# Patient Record
Sex: Male | Born: 1940
Health system: Southern US, Community
[De-identification: ages and names within clinical notes are randomized; demographics above are authoritative.]

## PROBLEM LIST (undated history)

## (undated) DIAGNOSIS — I10 Essential (primary) hypertension: Secondary | ICD-10-CM

## (undated) DIAGNOSIS — E210 Primary hyperparathyroidism: Secondary | ICD-10-CM

## (undated) DIAGNOSIS — M1990 Unspecified osteoarthritis, unspecified site: Secondary | ICD-10-CM

## (undated) DIAGNOSIS — R7303 Prediabetes: Secondary | ICD-10-CM

## (undated) DIAGNOSIS — J439 Emphysema, unspecified: Secondary | ICD-10-CM

## (undated) DIAGNOSIS — J449 Chronic obstructive pulmonary disease, unspecified: Secondary | ICD-10-CM

## (undated) DIAGNOSIS — R918 Other nonspecific abnormal finding of lung field: Secondary | ICD-10-CM

## (undated) DIAGNOSIS — C180 Malignant neoplasm of cecum: Secondary | ICD-10-CM

## (undated) HISTORY — DX: Primary hyperparathyroidism: E21.0

## (undated) HISTORY — DX: Essential (primary) hypertension: I10

## (undated) HISTORY — DX: Malignant neoplasm of cecum: C18.0

## (undated) HISTORY — DX: Prediabetes: R73.03

## (undated) HISTORY — DX: Chronic obstructive pulmonary disease, unspecified: J44.9

## (undated) HISTORY — DX: Unspecified osteoarthritis, unspecified site: M19.90

## (undated) HISTORY — DX: Emphysema, unspecified: J43.9

## (undated) HISTORY — DX: Other nonspecific abnormal finding of lung field: R91.8

---
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
## 2011-11-15 HISTORY — PX: ROTATOR CUFF REPAIR: SHX139

## 2011-11-18 DIAGNOSIS — J449 Chronic obstructive pulmonary disease, unspecified: Secondary | ICD-10-CM | POA: Diagnosis not present

## 2011-11-21 DIAGNOSIS — M75120 Complete rotator cuff tear or rupture of unspecified shoulder, not specified as traumatic: Secondary | ICD-10-CM | POA: Diagnosis not present

## 2011-11-24 DIAGNOSIS — G8918 Other acute postprocedural pain: Secondary | ICD-10-CM | POA: Diagnosis not present

## 2011-11-24 DIAGNOSIS — M75120 Complete rotator cuff tear or rupture of unspecified shoulder, not specified as traumatic: Secondary | ICD-10-CM | POA: Diagnosis not present

## 2011-12-09 DIAGNOSIS — M75120 Complete rotator cuff tear or rupture of unspecified shoulder, not specified as traumatic: Secondary | ICD-10-CM | POA: Diagnosis not present

## 2012-01-13 DIAGNOSIS — M6281 Muscle weakness (generalized): Secondary | ICD-10-CM | POA: Diagnosis not present

## 2012-01-13 DIAGNOSIS — IMO0001 Reserved for inherently not codable concepts without codable children: Secondary | ICD-10-CM | POA: Diagnosis not present

## 2012-01-13 DIAGNOSIS — M25519 Pain in unspecified shoulder: Secondary | ICD-10-CM | POA: Diagnosis not present

## 2012-01-13 DIAGNOSIS — M25619 Stiffness of unspecified shoulder, not elsewhere classified: Secondary | ICD-10-CM | POA: Diagnosis not present

## 2012-02-13 DIAGNOSIS — M25519 Pain in unspecified shoulder: Secondary | ICD-10-CM | POA: Diagnosis not present

## 2012-02-13 DIAGNOSIS — M25619 Stiffness of unspecified shoulder, not elsewhere classified: Secondary | ICD-10-CM | POA: Diagnosis not present

## 2012-02-13 DIAGNOSIS — IMO0001 Reserved for inherently not codable concepts without codable children: Secondary | ICD-10-CM | POA: Diagnosis not present

## 2012-02-15 DIAGNOSIS — M25519 Pain in unspecified shoulder: Secondary | ICD-10-CM | POA: Diagnosis not present

## 2012-02-15 DIAGNOSIS — IMO0001 Reserved for inherently not codable concepts without codable children: Secondary | ICD-10-CM | POA: Diagnosis not present

## 2012-02-15 DIAGNOSIS — M25619 Stiffness of unspecified shoulder, not elsewhere classified: Secondary | ICD-10-CM | POA: Diagnosis not present

## 2012-02-17 DIAGNOSIS — M25619 Stiffness of unspecified shoulder, not elsewhere classified: Secondary | ICD-10-CM | POA: Diagnosis not present

## 2012-02-17 DIAGNOSIS — M25519 Pain in unspecified shoulder: Secondary | ICD-10-CM | POA: Diagnosis not present

## 2012-02-17 DIAGNOSIS — IMO0001 Reserved for inherently not codable concepts without codable children: Secondary | ICD-10-CM | POA: Diagnosis not present

## 2012-02-20 DIAGNOSIS — M25519 Pain in unspecified shoulder: Secondary | ICD-10-CM | POA: Diagnosis not present

## 2012-02-20 DIAGNOSIS — IMO0001 Reserved for inherently not codable concepts without codable children: Secondary | ICD-10-CM | POA: Diagnosis not present

## 2012-02-20 DIAGNOSIS — M25619 Stiffness of unspecified shoulder, not elsewhere classified: Secondary | ICD-10-CM | POA: Diagnosis not present

## 2012-02-22 DIAGNOSIS — IMO0001 Reserved for inherently not codable concepts without codable children: Secondary | ICD-10-CM | POA: Diagnosis not present

## 2012-02-22 DIAGNOSIS — M25519 Pain in unspecified shoulder: Secondary | ICD-10-CM | POA: Diagnosis not present

## 2012-02-22 DIAGNOSIS — M25619 Stiffness of unspecified shoulder, not elsewhere classified: Secondary | ICD-10-CM | POA: Diagnosis not present

## 2012-02-27 DIAGNOSIS — IMO0001 Reserved for inherently not codable concepts without codable children: Secondary | ICD-10-CM | POA: Diagnosis not present

## 2012-02-27 DIAGNOSIS — M25619 Stiffness of unspecified shoulder, not elsewhere classified: Secondary | ICD-10-CM | POA: Diagnosis not present

## 2012-02-27 DIAGNOSIS — M25519 Pain in unspecified shoulder: Secondary | ICD-10-CM | POA: Diagnosis not present

## 2012-02-29 DIAGNOSIS — M25619 Stiffness of unspecified shoulder, not elsewhere classified: Secondary | ICD-10-CM | POA: Diagnosis not present

## 2012-02-29 DIAGNOSIS — IMO0001 Reserved for inherently not codable concepts without codable children: Secondary | ICD-10-CM | POA: Diagnosis not present

## 2012-02-29 DIAGNOSIS — M25519 Pain in unspecified shoulder: Secondary | ICD-10-CM | POA: Diagnosis not present

## 2012-03-02 DIAGNOSIS — M25619 Stiffness of unspecified shoulder, not elsewhere classified: Secondary | ICD-10-CM | POA: Diagnosis not present

## 2012-03-02 DIAGNOSIS — M25519 Pain in unspecified shoulder: Secondary | ICD-10-CM | POA: Diagnosis not present

## 2012-03-02 DIAGNOSIS — IMO0001 Reserved for inherently not codable concepts without codable children: Secondary | ICD-10-CM | POA: Diagnosis not present

## 2012-03-05 DIAGNOSIS — M25519 Pain in unspecified shoulder: Secondary | ICD-10-CM | POA: Diagnosis not present

## 2012-03-05 DIAGNOSIS — IMO0001 Reserved for inherently not codable concepts without codable children: Secondary | ICD-10-CM | POA: Diagnosis not present

## 2012-03-05 DIAGNOSIS — M25619 Stiffness of unspecified shoulder, not elsewhere classified: Secondary | ICD-10-CM | POA: Diagnosis not present

## 2012-03-07 DIAGNOSIS — M25519 Pain in unspecified shoulder: Secondary | ICD-10-CM | POA: Diagnosis not present

## 2012-03-07 DIAGNOSIS — M25619 Stiffness of unspecified shoulder, not elsewhere classified: Secondary | ICD-10-CM | POA: Diagnosis not present

## 2012-03-07 DIAGNOSIS — IMO0001 Reserved for inherently not codable concepts without codable children: Secondary | ICD-10-CM | POA: Diagnosis not present

## 2012-03-12 DIAGNOSIS — M25619 Stiffness of unspecified shoulder, not elsewhere classified: Secondary | ICD-10-CM | POA: Diagnosis not present

## 2012-03-12 DIAGNOSIS — IMO0001 Reserved for inherently not codable concepts without codable children: Secondary | ICD-10-CM | POA: Diagnosis not present

## 2012-03-12 DIAGNOSIS — M25519 Pain in unspecified shoulder: Secondary | ICD-10-CM | POA: Diagnosis not present

## 2012-03-14 DIAGNOSIS — M25619 Stiffness of unspecified shoulder, not elsewhere classified: Secondary | ICD-10-CM | POA: Diagnosis not present

## 2012-03-14 DIAGNOSIS — IMO0001 Reserved for inherently not codable concepts without codable children: Secondary | ICD-10-CM | POA: Diagnosis not present

## 2012-03-14 DIAGNOSIS — M25519 Pain in unspecified shoulder: Secondary | ICD-10-CM | POA: Diagnosis not present

## 2012-03-16 DIAGNOSIS — M25519 Pain in unspecified shoulder: Secondary | ICD-10-CM | POA: Diagnosis not present

## 2012-03-16 DIAGNOSIS — IMO0001 Reserved for inherently not codable concepts without codable children: Secondary | ICD-10-CM | POA: Diagnosis not present

## 2012-03-16 DIAGNOSIS — M25619 Stiffness of unspecified shoulder, not elsewhere classified: Secondary | ICD-10-CM | POA: Diagnosis not present

## 2012-03-19 DIAGNOSIS — M25519 Pain in unspecified shoulder: Secondary | ICD-10-CM | POA: Diagnosis not present

## 2012-03-19 DIAGNOSIS — M25619 Stiffness of unspecified shoulder, not elsewhere classified: Secondary | ICD-10-CM | POA: Diagnosis not present

## 2012-03-19 DIAGNOSIS — IMO0001 Reserved for inherently not codable concepts without codable children: Secondary | ICD-10-CM | POA: Diagnosis not present

## 2012-03-21 DIAGNOSIS — M25519 Pain in unspecified shoulder: Secondary | ICD-10-CM | POA: Diagnosis not present

## 2012-03-21 DIAGNOSIS — M25619 Stiffness of unspecified shoulder, not elsewhere classified: Secondary | ICD-10-CM | POA: Diagnosis not present

## 2012-03-21 DIAGNOSIS — IMO0001 Reserved for inherently not codable concepts without codable children: Secondary | ICD-10-CM | POA: Diagnosis not present

## 2012-03-23 DIAGNOSIS — IMO0001 Reserved for inherently not codable concepts without codable children: Secondary | ICD-10-CM | POA: Diagnosis not present

## 2012-03-23 DIAGNOSIS — M25619 Stiffness of unspecified shoulder, not elsewhere classified: Secondary | ICD-10-CM | POA: Diagnosis not present

## 2012-03-23 DIAGNOSIS — M25519 Pain in unspecified shoulder: Secondary | ICD-10-CM | POA: Diagnosis not present

## 2012-03-28 DIAGNOSIS — M25619 Stiffness of unspecified shoulder, not elsewhere classified: Secondary | ICD-10-CM | POA: Diagnosis not present

## 2012-03-28 DIAGNOSIS — M25519 Pain in unspecified shoulder: Secondary | ICD-10-CM | POA: Diagnosis not present

## 2012-03-28 DIAGNOSIS — IMO0001 Reserved for inherently not codable concepts without codable children: Secondary | ICD-10-CM | POA: Diagnosis not present

## 2012-03-30 DIAGNOSIS — M25619 Stiffness of unspecified shoulder, not elsewhere classified: Secondary | ICD-10-CM | POA: Diagnosis not present

## 2012-03-30 DIAGNOSIS — M25519 Pain in unspecified shoulder: Secondary | ICD-10-CM | POA: Diagnosis not present

## 2012-03-30 DIAGNOSIS — IMO0001 Reserved for inherently not codable concepts without codable children: Secondary | ICD-10-CM | POA: Diagnosis not present

## 2012-04-02 DIAGNOSIS — M25519 Pain in unspecified shoulder: Secondary | ICD-10-CM | POA: Diagnosis not present

## 2012-04-02 DIAGNOSIS — IMO0001 Reserved for inherently not codable concepts without codable children: Secondary | ICD-10-CM | POA: Diagnosis not present

## 2012-04-02 DIAGNOSIS — M25619 Stiffness of unspecified shoulder, not elsewhere classified: Secondary | ICD-10-CM | POA: Diagnosis not present

## 2012-04-04 DIAGNOSIS — M25519 Pain in unspecified shoulder: Secondary | ICD-10-CM | POA: Diagnosis not present

## 2012-04-04 DIAGNOSIS — IMO0001 Reserved for inherently not codable concepts without codable children: Secondary | ICD-10-CM | POA: Diagnosis not present

## 2012-04-04 DIAGNOSIS — M25619 Stiffness of unspecified shoulder, not elsewhere classified: Secondary | ICD-10-CM | POA: Diagnosis not present

## 2012-04-13 DIAGNOSIS — M25519 Pain in unspecified shoulder: Secondary | ICD-10-CM | POA: Diagnosis not present

## 2012-04-13 DIAGNOSIS — IMO0001 Reserved for inherently not codable concepts without codable children: Secondary | ICD-10-CM | POA: Diagnosis not present

## 2012-04-13 DIAGNOSIS — M25619 Stiffness of unspecified shoulder, not elsewhere classified: Secondary | ICD-10-CM | POA: Diagnosis not present

## 2012-04-23 DIAGNOSIS — M25619 Stiffness of unspecified shoulder, not elsewhere classified: Secondary | ICD-10-CM | POA: Diagnosis not present

## 2012-04-23 DIAGNOSIS — M25519 Pain in unspecified shoulder: Secondary | ICD-10-CM | POA: Diagnosis not present

## 2012-04-23 DIAGNOSIS — IMO0001 Reserved for inherently not codable concepts without codable children: Secondary | ICD-10-CM | POA: Diagnosis not present

## 2012-04-25 DIAGNOSIS — M25619 Stiffness of unspecified shoulder, not elsewhere classified: Secondary | ICD-10-CM | POA: Diagnosis not present

## 2012-04-25 DIAGNOSIS — M25519 Pain in unspecified shoulder: Secondary | ICD-10-CM | POA: Diagnosis not present

## 2012-04-25 DIAGNOSIS — IMO0001 Reserved for inherently not codable concepts without codable children: Secondary | ICD-10-CM | POA: Diagnosis not present

## 2012-04-27 DIAGNOSIS — M25619 Stiffness of unspecified shoulder, not elsewhere classified: Secondary | ICD-10-CM | POA: Diagnosis not present

## 2012-04-27 DIAGNOSIS — M25519 Pain in unspecified shoulder: Secondary | ICD-10-CM | POA: Diagnosis not present

## 2012-04-27 DIAGNOSIS — IMO0001 Reserved for inherently not codable concepts without codable children: Secondary | ICD-10-CM | POA: Diagnosis not present

## 2012-04-30 DIAGNOSIS — M25519 Pain in unspecified shoulder: Secondary | ICD-10-CM | POA: Diagnosis not present

## 2012-04-30 DIAGNOSIS — IMO0001 Reserved for inherently not codable concepts without codable children: Secondary | ICD-10-CM | POA: Diagnosis not present

## 2012-04-30 DIAGNOSIS — M25619 Stiffness of unspecified shoulder, not elsewhere classified: Secondary | ICD-10-CM | POA: Diagnosis not present

## 2012-12-04 DIAGNOSIS — H02059 Trichiasis without entropian unspecified eye, unspecified eyelid: Secondary | ICD-10-CM | POA: Diagnosis not present

## 2012-12-04 DIAGNOSIS — H2510 Age-related nuclear cataract, unspecified eye: Secondary | ICD-10-CM | POA: Diagnosis not present

## 2012-12-24 DIAGNOSIS — J449 Chronic obstructive pulmonary disease, unspecified: Secondary | ICD-10-CM | POA: Diagnosis not present

## 2012-12-24 DIAGNOSIS — I10 Essential (primary) hypertension: Secondary | ICD-10-CM | POA: Diagnosis not present

## 2012-12-24 DIAGNOSIS — Z23 Encounter for immunization: Secondary | ICD-10-CM | POA: Diagnosis not present

## 2012-12-25 DIAGNOSIS — I2510 Atherosclerotic heart disease of native coronary artery without angina pectoris: Secondary | ICD-10-CM | POA: Diagnosis not present

## 2012-12-25 DIAGNOSIS — Z125 Encounter for screening for malignant neoplasm of prostate: Secondary | ICD-10-CM | POA: Diagnosis not present

## 2012-12-25 DIAGNOSIS — I10 Essential (primary) hypertension: Secondary | ICD-10-CM | POA: Diagnosis not present

## 2012-12-25 DIAGNOSIS — R7989 Other specified abnormal findings of blood chemistry: Secondary | ICD-10-CM | POA: Diagnosis not present

## 2012-12-25 DIAGNOSIS — R0602 Shortness of breath: Secondary | ICD-10-CM | POA: Diagnosis not present

## 2013-01-21 DIAGNOSIS — R0609 Other forms of dyspnea: Secondary | ICD-10-CM | POA: Diagnosis not present

## 2013-01-22 DIAGNOSIS — J984 Other disorders of lung: Secondary | ICD-10-CM | POA: Diagnosis not present

## 2013-01-22 DIAGNOSIS — R059 Cough, unspecified: Secondary | ICD-10-CM | POA: Diagnosis not present

## 2013-01-22 DIAGNOSIS — I517 Cardiomegaly: Secondary | ICD-10-CM | POA: Diagnosis not present

## 2013-01-25 ENCOUNTER — Encounter: Payer: Self-pay | Admitting: *Deleted

## 2013-01-25 DIAGNOSIS — M1990 Unspecified osteoarthritis, unspecified site: Secondary | ICD-10-CM | POA: Insufficient documentation

## 2013-01-29 ENCOUNTER — Ambulatory Visit (INDEPENDENT_AMBULATORY_CARE_PROVIDER_SITE_OTHER): Payer: Medicare Other | Admitting: Family Medicine

## 2013-01-29 ENCOUNTER — Encounter: Payer: Self-pay | Admitting: Family Medicine

## 2013-01-29 VITALS — BP 150/90 | HR 62 | Temp 98.6°F | Resp 20 | Ht 66.0 in | Wt 180.0 lb

## 2013-01-29 DIAGNOSIS — I10 Essential (primary) hypertension: Secondary | ICD-10-CM

## 2013-01-29 DIAGNOSIS — E210 Primary hyperparathyroidism: Secondary | ICD-10-CM

## 2013-01-29 MED ORDER — AMLODIPINE BESYLATE 5 MG PO TABS: 5.0000 mg | ORAL_TABLET | Freq: Every day | ORAL | Status: DC

## 2013-01-29 NOTE — Progress Notes (Signed)
Subjective:     Patient ID: Nicholas Williams, male   DOB: 05-26-1941, 72 y.o.   MRN: [id number]  HPI  Patient is here for followup of his blood work. He was originally found to have hypercalcemia with a calcium of 11.4 in February.  HCTZ was held in his lab work was repeated on 01/22/2013.  Calcium was again elevated at 11.1 however parathyroid hormone level was also elevated at 184.8.  He denies nephrolithiasis, abdominal pain, or bone pain.  He's never been told his calcium was high previously. With regards to his hypertension, he has been on Diovan 320 mg by mouth daily.  His blood pressure has been averaging 120-152 systolic over 70-94 diastolic.  He denies chest pain, but reports dyspnea on exertion.   Review of Systems  Constitutional: Negative.   HENT: Negative.   Eyes: Negative.   Respiratory: Positive for shortness of breath.   Cardiovascular: Negative.   Gastrointestinal: Negative.   Endocrine: Negative.   Psychiatric/Behavioral: Negative.        Objective:   Physical Exam  Vitals reviewed. Constitutional: He is oriented to person, place, and time. He appears well-developed and well-nourished.  HENT:  Head: Normocephalic and atraumatic.  Eyes: Conjunctivae and EOM are normal. Pupils are equal, round, and reactive to light.  Neck: Normal range of motion. Neck supple. No thyromegaly present.  Cardiovascular: Normal rate, regular rhythm and normal heart sounds.   No murmur heard. Pulmonary/Chest: He has no wheezes. He has rales.  Abdominal: Soft. Bowel sounds are normal.  Musculoskeletal: Normal range of motion.  Neurological: He is alert and oriented to person, place, and time. No cranial nerve deficit.       Assessment:     Primary hyperparathyroidism Hypertension    Plan:     We discussed surgical excision of the parathyroid glands versus clinical monitoring.  Patient elects to proceed clinical monitoring he return in 6 months for a BMP to measure calcium and  creatinine. I also scheduled him for a DEXA scan to evaluate for secondary osteoporosis.  We will also consider starting him on a bisphosphonate.  We discussed indications proceed with surgical excision.  Patient was informed when to return early regarding symptoms.  His blood pressure is high and I will add Norvasc 5 mg by mouth daily, recheck his blood pressures in one month.  Regards to dyspnea on exertion, he will try symbicort in place of Advair for one week.  If dyspnea does not improve discontinue beta blockers given his COPD.  If dyspnea does not improve at that point will consider stress test of the heart.

## 2013-01-29 NOTE — Patient Instructions (Addendum)
followup labs in 6 months.  Call in 1 week to see how your breathing is on symbicort.

## 2013-03-06 ENCOUNTER — Other Ambulatory Visit: Payer: Self-pay | Admitting: Family Medicine

## 2013-03-06 DIAGNOSIS — M810 Age-related osteoporosis without current pathological fracture: Secondary | ICD-10-CM

## 2013-03-25 ENCOUNTER — Ambulatory Visit: Payer: Medicare Other | Admitting: Family Medicine

## 2013-03-26 ENCOUNTER — Other Ambulatory Visit: Payer: Self-pay | Admitting: Family Medicine

## 2013-03-26 DIAGNOSIS — E213 Hyperparathyroidism, unspecified: Secondary | ICD-10-CM

## 2013-03-26 MED ORDER — ALENDRONATE SODIUM 70 MG PO TABS: 70.0000 mg | ORAL_TABLET | ORAL | Status: DC

## 2013-05-02 ENCOUNTER — Ambulatory Visit (INDEPENDENT_AMBULATORY_CARE_PROVIDER_SITE_OTHER): Payer: Medicare Other | Admitting: Family Medicine

## 2013-05-02 ENCOUNTER — Encounter: Payer: Self-pay | Admitting: Family Medicine

## 2013-05-02 VITALS — BP 126/62 | HR 72 | Temp 97.1°F | Resp 22 | Wt 180.0 lb

## 2013-05-02 DIAGNOSIS — I10 Essential (primary) hypertension: Secondary | ICD-10-CM

## 2013-05-02 DIAGNOSIS — E213 Hyperparathyroidism, unspecified: Secondary | ICD-10-CM | POA: Diagnosis not present

## 2013-05-02 LAB — COMPLETE METABOLIC PANEL WITH GFR
Alkaline Phosphatase: 99 U/L (ref 39–117)
GFR, Est Non African American: 89 mL/min
Glucose, Bld: 94 mg/dL (ref 70–99)
Sodium: 140 mEq/L (ref 135–145)
Total Bilirubin: 1 mg/dL (ref 0.3–1.2)
Total Protein: 7 g/dL (ref 6.0–8.3)

## 2013-05-02 MED ORDER — BUDESONIDE-FORMOTEROL FUMARATE 160-4.5 MCG/ACT IN AERO: 2.0000 | INHALATION_SPRAY | Freq: Two times a day (BID) | RESPIRATORY_TRACT | Status: DC

## 2013-05-02 MED ORDER — EPINEPHRINE 0.3 MG/0.3ML IJ SOAJ: 0.3000 mg | Freq: Once | INTRAMUSCULAR | Status: AC

## 2013-05-02 NOTE — Progress Notes (Signed)
Subjective:    Patient ID: Nicholas Williams, male    DOB: 1941/07/29, 72 y.o.   MRN: [id number]  HPI Patient is here for followup of his hypertension and his hyperparathyroidism. He is due to have his calcium level rechecked. His blood pressure is currently well controlled on Norvasc 5 mg by mouth daily, Lopressor 50 mg by mouth twice a day, and Diovan 320 mg by mouth daily.  His home blood pressures range 120 130/80 he denies chest pain and shortness of breath. He is currently taking Fosamax due to his hyperparathyroidism. His insurance was not going to pay for his DEXA scan and so it was not performed. Past Medical History  Diagnosis Date  . COPD (chronic obstructive pulmonary disease)   . Emphysema of lung   . Hypertension   . Arthritis   . Hyperparathyroidism, primary    Current Outpatient Prescriptions on File Prior to Visit  Medication Sig Dispense Refill  . albuterol-ipratropium (COMBIVENT) 18-103 MCG/ACT inhaler Inhale 2 puffs into the lungs every 6 (six) hours as needed for wheezing.      Marland Kitchen alendronate (FOSAMAX) 70 MG tablet Take 1 tablet (70 mg total) by mouth every 7 (seven) days. Take with a full glass of water on an empty stomach.  4 tablet  11  . amLODipine (NORVASC) 5 MG tablet Take 1 tablet (5 mg total) by mouth daily.  90 tablet  3  . aspirin 81 MG tablet Take 81 mg by mouth daily.      . metoprolol (LOPRESSOR) 50 MG tablet Take 50 mg by mouth 2 (two) times daily.      . Tiotropium Bromide Monohydrate (SPIRIVA HANDIHALER IN) Inhale 2 puffs into the lungs daily.      . valsartan (DIOVAN) 320 MG tablet Take 320 mg by mouth daily.       No current facility-administered medications on file prior to visit.   No Known Allergies History   Social History  . Marital Status: Married    Spouse Name: N/A    Number of Children: N/A  . Years of Education: N/A   Occupational History  . Not on file.   Social History Main Topics  . Smoking status: Former Smoker    Types:  Cigarettes  . Smokeless tobacco: Former Neurosurgeon    Quit date: 11/14/1993     Comment: 3 packs a day  . Alcohol Use: Yes     Comment: 3-4 beers a day prn  . Drug Use: No  . Sexually Active: Not on file   Other Topics Concern  . Not on file   Social History Narrative  . No narrative on file      Review of Systems  All other systems reviewed and are negative.       Objective:   Physical Exam  Vitals reviewed. Constitutional: He appears well-developed and well-nourished.  Neck: No thyromegaly present.  Cardiovascular: Normal rate, regular rhythm and normal heart sounds.   No murmur heard. Pulmonary/Chest: Effort normal and breath sounds normal. No respiratory distress. He has no wheezes. He has no rales.  Abdominal: Soft. Bowel sounds are normal. He exhibits no distension. There is no tenderness. There is no rebound.  Lymphadenopathy:    He has no cervical adenopathy.          Assessment & Plan:  1. Hyperparathyroidism Recheck the patient's calcium. Time being he is asymptomatic. He has no history of kidney stones. He has no history of abdominal pain. His blood pressures well  controlled. Unless his calcium continues to rise he does not require surgical excision of the parathyroid glands. - COMPLETE METABOLIC PANEL WITH GFR  2. HTN (hypertension) Blood pressures well controlled. Continue current medications at present dosages.

## 2013-12-19 ENCOUNTER — Other Ambulatory Visit: Payer: Self-pay | Admitting: Family Medicine

## 2014-01-13 ENCOUNTER — Other Ambulatory Visit: Payer: Self-pay | Admitting: Family Medicine

## 2014-01-14 NOTE — Telephone Encounter (Signed)
Medication filled x1 with no refills.   Requires office visit before any further refills can be given.  

## 2014-01-27 ENCOUNTER — Other Ambulatory Visit: Payer: Self-pay | Admitting: Family Medicine

## 2014-02-12 ENCOUNTER — Other Ambulatory Visit: Payer: Self-pay | Admitting: Family Medicine

## 2014-04-14 ENCOUNTER — Other Ambulatory Visit: Payer: Self-pay | Admitting: Family Medicine

## 2014-06-24 ENCOUNTER — Other Ambulatory Visit: Payer: Self-pay | Admitting: Family Medicine

## 2014-07-24 ENCOUNTER — Other Ambulatory Visit: Payer: Self-pay | Admitting: Family Medicine

## 2014-07-25 NOTE — Telephone Encounter (Signed)
Medication filled x1 with no refills.   Requires office visit before any further refills can be given.   Letter sent.  

## 2014-08-19 ENCOUNTER — Ambulatory Visit (INDEPENDENT_AMBULATORY_CARE_PROVIDER_SITE_OTHER): Payer: Medicare Other | Admitting: Family Medicine

## 2014-08-19 ENCOUNTER — Encounter: Payer: Self-pay | Admitting: Family Medicine

## 2014-08-19 VITALS — BP 126/72 | HR 68 | Temp 97.8°F | Resp 20 | Ht 66.0 in | Wt 171.0 lb

## 2014-08-19 DIAGNOSIS — Z0000 Encounter for general adult medical examination without abnormal findings: Secondary | ICD-10-CM

## 2014-08-19 DIAGNOSIS — Z23 Encounter for immunization: Secondary | ICD-10-CM

## 2014-08-19 DIAGNOSIS — E210 Primary hyperparathyroidism: Secondary | ICD-10-CM | POA: Diagnosis not present

## 2014-08-19 DIAGNOSIS — I10 Essential (primary) hypertension: Secondary | ICD-10-CM

## 2014-08-19 DIAGNOSIS — J449 Chronic obstructive pulmonary disease, unspecified: Secondary | ICD-10-CM

## 2014-08-19 NOTE — Progress Notes (Signed)
Subjective:    Patient ID: Nicholas Williams, male    DOB: 22-Jul-1941, 73 y.o.   MRN: [id number]  HPI Patient is here today for complete physical exam. He is due for a colonoscopy as well as a flu shot. He refuses the colonoscopy and the flu shot. The remainder of his vaccines are up-to-date. He does have hyperparathyroidism. He is due for a PTH. Currently we're managing it clinical monitoring and bisphosphonate for prevention of osteoporosis. Patient does have COPD. He is currently taking spiriva daily but finds that he is having to use albuterol 2-3 times a day with heavy exertion. He is not using symbicort because he cannot afford the extra co-pay. Past Medical History  Diagnosis Date  . COPD (chronic obstructive pulmonary disease)   . Emphysema of lung   . Hypertension   . Arthritis   . Hyperparathyroidism, primary    Past Surgical History  Procedure Laterality Date  . Hernia repair  1954  . Appendectomy  1956  . Rotator cuff repair  11/2011   Current Outpatient Prescriptions on File Prior to Visit  Medication Sig Dispense Refill  . albuterol-ipratropium (COMBIVENT) 18-103 MCG/ACT inhaler Inhale 2 puffs into the lungs every 6 (six) hours as needed for wheezing.      Marland Kitchen alendronate (FOSAMAX) 70 MG tablet Take 1 tablet (70 mg total) by mouth every 7 (seven) days. Take with a full glass of water on an empty stomach.  4 tablet  11  . amLODipine (NORVASC) 5 MG tablet TAKE ONE TABLET BY MOUTH DAILY.  30 tablet  0  . aspirin 81 MG tablet Take 81 mg by mouth daily.      . budesonide-formoterol (SYMBICORT) 160-4.5 MCG/ACT inhaler Inhale 2 puffs into the lungs 2 (two) times daily.  1 Inhaler  11  . EPINEPHrine (EPIPEN 2-PAK) 0.3 mg/0.3 mL DEVI Inject 0.3 mLs (0.3 mg total) into the muscle once.  1 Device  2  . losartan (COZAAR) 100 MG tablet TAKE ONE TABLET BY MOUTH EVERY DAY.  30 tablet  11  . metoprolol (LOPRESSOR) 50 MG tablet TAKE ONE TABLET BY MOUTH TWICE DAILY.  60 tablet  5  . SPIRIVA  HANDIHALER 18 MCG inhalation capsule INHALE 1 CAPSULE DAILY USING HANDIHALER DEVICE AS DIRECTED.  30 capsule  6  . Tiotropium Bromide Monohydrate (SPIRIVA HANDIHALER IN) Inhale 2 puffs into the lungs daily.      . valsartan (DIOVAN) 320 MG tablet Take 320 mg by mouth daily.       No current facility-administered medications on file prior to visit.   No Known Allergies History   Social History  . Marital Status: Married    Spouse Name: N/A    Number of Children: N/A  . Years of Education: N/A   Occupational History  . Not on file.   Social History Main Topics  . Smoking status: Former Smoker    Types: Cigarettes  . Smokeless tobacco: Former Systems developer    Quit date: 11/14/1993     Comment: 3 packs a day  . Alcohol Use: Yes     Comment: 3-4 beers a day prn  . Drug Use: No  . Sexual Activity: Not on file   Other Topics Concern  . Not on file   Social History Narrative  . No narrative on file   No family history on file.    Review of Systems  All other systems reviewed and are negative.      Objective:   Physical  Exam  Vitals reviewed. Constitutional: He is oriented to person, place, and time. He appears well-developed and well-nourished. No distress.  HENT:  Head: Normocephalic and atraumatic.  Right Ear: External ear normal.  Left Ear: External ear normal.  Nose: Nose normal.  Mouth/Throat: Oropharynx is clear and moist. No oropharyngeal exudate.  Eyes: Conjunctivae and EOM are normal. Pupils are equal, round, and reactive to light. Right eye exhibits no discharge. Left eye exhibits no discharge. No scleral icterus.  Neck: Normal range of motion. Neck supple. No JVD present. No tracheal deviation present. No thyromegaly present.  Cardiovascular: Normal rate, regular rhythm and normal heart sounds.  Exam reveals no gallop and no friction rub.   No murmur heard. Pulmonary/Chest: Effort normal and breath sounds normal. No stridor. No respiratory distress. He has no  wheezes. He has no rales. He exhibits no tenderness.  Abdominal: Soft. Bowel sounds are normal. He exhibits no distension and no mass. There is no tenderness. There is no rebound and no guarding.  Genitourinary: Rectum normal, prostate normal and penis normal.  Musculoskeletal: Normal range of motion. He exhibits no edema and no tenderness.  Lymphadenopathy:    He has no cervical adenopathy.  Neurological: He is alert and oriented to person, place, and time. He has normal reflexes. He displays normal reflexes. No cranial nerve deficit. He exhibits normal muscle tone. Coordination normal.  Skin: Skin is warm. No rash noted. He is not diaphoretic. No erythema. No pallor.  Psychiatric: He has a normal mood and affect. His behavior is normal. Judgment and thought content normal.          Assessment & Plan:  Need for prophylactic vaccination and inoculation against influenza - Plan: Flu Vaccine QUAD 36+ mos IM  Routine general medical examination at a health care facility - Plan: CBC with Differential, PSA, Medicare  Hyperparathyroidism, primary - Plan: Parathyroid hormone, intact (no Ca)  Essential hypertension - Plan: CBC with Differential, COMPLETE METABOLIC PANEL WITH GFR, Lipid panel  Chronic obstructive pulmonary disease, unspecified COPD, unspecified chronic bronchitis type  patient's blood pressure is excellent. All have him return fasting for a CMP as well as a fasting lipid panel. Goal LDL cholesterols less than 130.DC spiriva and replace with stiolto 2 inh daily.  Recommended flu shot and the patient finally agreed. Recommend colonoscopy but the patient declined. Return fasting for lipid panel PSA CBC PTH.

## 2014-08-21 ENCOUNTER — Other Ambulatory Visit: Payer: Self-pay | Admitting: Family Medicine

## 2014-08-21 NOTE — Telephone Encounter (Signed)
Refill appropriate and filled per protocol. 

## 2014-08-25 ENCOUNTER — Other Ambulatory Visit: Payer: Medicare Other

## 2014-08-25 DIAGNOSIS — Z0000 Encounter for general adult medical examination without abnormal findings: Secondary | ICD-10-CM | POA: Diagnosis not present

## 2014-08-25 DIAGNOSIS — I10 Essential (primary) hypertension: Secondary | ICD-10-CM | POA: Diagnosis not present

## 2014-08-25 DIAGNOSIS — E210 Primary hyperparathyroidism: Secondary | ICD-10-CM | POA: Diagnosis not present

## 2014-08-25 LAB — CBC WITH DIFFERENTIAL/PLATELET
Basophils Absolute: 0 10*3/uL (ref 0.0–0.1)
Basophils Relative: 1 % (ref 0–1)
EOS ABS: 0.1 10*3/uL (ref 0.0–0.7)
Eosinophils Relative: 3 % (ref 0–5)
HEMATOCRIT: 39 % (ref 39.0–52.0)
HEMOGLOBIN: 13.6 g/dL (ref 13.0–17.0)
Lymphocytes Relative: 36 % (ref 12–46)
Lymphs Abs: 1.4 10*3/uL (ref 0.7–4.0)
MCH: 30.1 pg (ref 26.0–34.0)
MCHC: 34.9 g/dL (ref 30.0–36.0)
MCV: 86.3 fL (ref 78.0–100.0)
MONO ABS: 0.4 10*3/uL (ref 0.1–1.0)
MONOS PCT: 10 % (ref 3–12)
Neutro Abs: 2 10*3/uL (ref 1.7–7.7)
Neutrophils Relative %: 50 % (ref 43–77)
Platelets: 200 10*3/uL (ref 150–400)
RBC: 4.52 MIL/uL (ref 4.22–5.81)
RDW: 13.3 % (ref 11.5–15.5)
WBC: 4 10*3/uL (ref 4.0–10.5)

## 2014-08-25 LAB — COMPLETE METABOLIC PANEL WITH GFR
ALBUMIN: 4.1 g/dL (ref 3.5–5.2)
ALK PHOS: 102 U/L (ref 39–117)
ALT: 12 U/L (ref 0–53)
AST: 14 U/L (ref 0–37)
BILIRUBIN TOTAL: 0.8 mg/dL (ref 0.2–1.2)
BUN: 7 mg/dL (ref 6–23)
CO2: 26 meq/L (ref 19–32)
Calcium: 10.5 mg/dL (ref 8.4–10.5)
Chloride: 106 mEq/L (ref 96–112)
Creat: 0.75 mg/dL (ref 0.50–1.35)
GLUCOSE: 107 mg/dL — AB (ref 70–99)
Potassium: 4.1 mEq/L (ref 3.5–5.3)
Sodium: 139 mEq/L (ref 135–145)
Total Protein: 6.6 g/dL (ref 6.0–8.3)

## 2014-08-25 LAB — LIPID PANEL
CHOL/HDL RATIO: 4.2 ratio
CHOLESTEROL: 163 mg/dL (ref 0–200)
HDL: 39 mg/dL — ABNORMAL LOW (ref >39)
LDL Cholesterol: 91 mg/dL (ref 0–99)
Triglycerides: 163 mg/dL — ABNORMAL HIGH (ref <150)
VLDL: 33 mg/dL (ref 0–40)

## 2014-08-26 LAB — PSA, MEDICARE: PSA: 1.03 ng/mL (ref <=4.00)

## 2014-08-26 LAB — PARATHYROID HORMONE, INTACT (NO CA): PTH: 120 pg/mL — ABNORMAL HIGH (ref 14–64)

## 2014-09-17 ENCOUNTER — Telehealth: Payer: Self-pay | Admitting: Family Medicine

## 2014-09-17 MED ORDER — TIOTROPIUM BROMIDE-OLODATEROL 2.5-2.5 MCG/ACT IN AERS: 2.5000 ug | INHALATION_SPRAY | Freq: Every day | RESPIRATORY_TRACT | Status: DC

## 2014-09-17 NOTE — Telephone Encounter (Signed)
Med sent to pharm 

## 2014-09-17 NOTE — Telephone Encounter (Signed)
PA submitted through [email or web address]   Received call from insurance stating that pt must try Anoro as it is on his formulary. OK to change and if so need sig.

## 2014-09-17 NOTE — Telephone Encounter (Signed)
Patient is calling because we gave him samples of stiolto and would now like an rx sent in for this if possible  Pharmacy is [facility]  [phone] with questions

## 2014-09-18 NOTE — Telephone Encounter (Signed)
Ok switching stiolto to [facility]

## 2014-09-22 ENCOUNTER — Telehealth: Payer: Self-pay | Admitting: Family Medicine

## 2014-09-22 MED ORDER — UMECLIDINIUM-VILANTEROL 62.5-25 MCG/INH IN AEPB: INHALATION_SPRAY | RESPIRATORY_TRACT | Status: DC

## 2014-09-22 NOTE — Telephone Encounter (Signed)
Med called to pharm and pt aware that he must try Anoro first before his insurance will cover [place].

## 2014-09-22 NOTE — Telephone Encounter (Signed)
Pt aware and med sent to pharm 

## 2014-09-22 NOTE — Telephone Encounter (Signed)
Patient calling to speak to you ONLY about his stiolto he would barely give my ANY info on the phone [phone]

## 2014-12-18 ENCOUNTER — Other Ambulatory Visit: Payer: Self-pay | Admitting: Family Medicine

## 2015-01-16 ENCOUNTER — Telehealth: Payer: Self-pay | Admitting: Family Medicine

## 2015-01-16 NOTE — Telephone Encounter (Signed)
Patient is calling to speak with you regarding a personal matter  [phone]

## 2015-01-16 NOTE — Telephone Encounter (Signed)
LMTRC

## 2015-01-19 NOTE — Telephone Encounter (Signed)
Spoke to pt 01/18/15 and questions answered

## 2015-03-05 ENCOUNTER — Other Ambulatory Visit: Payer: Self-pay | Admitting: Family Medicine

## 2015-03-19 ENCOUNTER — Other Ambulatory Visit: Payer: Self-pay | Admitting: Family Medicine

## 2015-04-02 ENCOUNTER — Encounter: Payer: Self-pay | Admitting: Family Medicine

## 2015-04-02 ENCOUNTER — Telehealth: Payer: Self-pay | Admitting: Family Medicine

## 2015-04-02 ENCOUNTER — Other Ambulatory Visit: Payer: Self-pay | Admitting: Family Medicine

## 2015-04-02 NOTE — Telephone Encounter (Signed)
Patient requesting speak with you regarding medication

## 2015-04-02 NOTE — Telephone Encounter (Signed)
Med refill x 1.  Pt needs to be seen.  Letter sent

## 2015-04-02 NOTE — Telephone Encounter (Signed)
LMTRC

## 2015-04-03 NOTE — Telephone Encounter (Signed)
Spoke to pt 04/02/15 and questions were answered and med refilled

## 2015-05-04 ENCOUNTER — Other Ambulatory Visit: Payer: Self-pay | Admitting: Family Medicine

## 2015-05-05 ENCOUNTER — Telehealth: Payer: Self-pay | Admitting: Family Medicine

## 2015-05-05 NOTE — Telephone Encounter (Signed)
Pt was sent a letter to come in for an ov - pt is taking care of a very sick wife and is unable to come in for an appt b/c there is no one to take care of her but him. He needs some medications refilled as he received a letter stating we would not refill his meds until ov. Per WTP ok to refill his medications for a year and no appt necessary until that time. Meds sent to pharm and pt aware and if he has any other problems he is to contact me.

## 2015-05-05 NOTE — Telephone Encounter (Signed)
Patient calling to let you know that he needs refills and know that dr pickard needs him to come in for ov for this, he would like to speak to you regarding not being able to come in  Please call him at [phone]

## 2015-05-19 ENCOUNTER — Other Ambulatory Visit: Payer: Self-pay | Admitting: Family Medicine

## 2015-08-03 ENCOUNTER — Other Ambulatory Visit: Payer: Self-pay | Admitting: Family Medicine

## 2015-09-01 ENCOUNTER — Other Ambulatory Visit: Payer: Self-pay | Admitting: Family Medicine

## 2015-09-01 NOTE — Telephone Encounter (Signed)
Medication filled x1 with no refills.   Requires office visit before any further refills can be given.   Letter sent.  

## 2015-09-18 ENCOUNTER — Other Ambulatory Visit: Payer: Self-pay | Admitting: Family Medicine

## 2015-09-18 ENCOUNTER — Encounter: Payer: Self-pay | Admitting: Family Medicine

## 2015-09-18 NOTE — Telephone Encounter (Signed)
Medication refill for one time only.  Patient needs to be seen.  Letter sent for patient to call and schedule 

## 2015-09-29 ENCOUNTER — Telehealth: Payer: Self-pay | Admitting: Family Medicine

## 2015-09-29 NOTE — Telephone Encounter (Signed)
LMTRC

## 2015-09-29 NOTE — Telephone Encounter (Signed)
Patient is calling to speak with you only would not give reason why  [phone]

## 2015-09-29 NOTE — Telephone Encounter (Signed)
Had questions about medication - questions answered

## 2015-11-16 ENCOUNTER — Other Ambulatory Visit: Payer: Self-pay | Admitting: Family Medicine

## 2016-02-15 ENCOUNTER — Other Ambulatory Visit: Payer: Self-pay | Admitting: Family Medicine

## 2016-02-16 NOTE — Telephone Encounter (Signed)
Refill appropriate and filled per protocol. 

## 2016-02-19 ENCOUNTER — Ambulatory Visit: Payer: Medicare Other | Admitting: Family Medicine

## 2016-02-26 ENCOUNTER — Encounter: Payer: Self-pay | Admitting: Family Medicine

## 2016-02-26 ENCOUNTER — Ambulatory Visit (INDEPENDENT_AMBULATORY_CARE_PROVIDER_SITE_OTHER): Payer: Medicare Other | Admitting: Family Medicine

## 2016-02-26 VITALS — BP 142/80 | HR 72 | Temp 97.7°F | Resp 16 | Wt 178.0 lb

## 2016-02-26 DIAGNOSIS — Z125 Encounter for screening for malignant neoplasm of prostate: Secondary | ICD-10-CM

## 2016-02-26 DIAGNOSIS — I10 Essential (primary) hypertension: Secondary | ICD-10-CM | POA: Diagnosis not present

## 2016-02-26 DIAGNOSIS — J449 Chronic obstructive pulmonary disease, unspecified: Secondary | ICD-10-CM | POA: Diagnosis not present

## 2016-02-26 LAB — COMPLETE METABOLIC PANEL WITH GFR
ALT: 13 U/L (ref 9–46)
AST: 14 U/L (ref 10–35)
Albumin: 4.4 g/dL (ref 3.6–5.1)
Alkaline Phosphatase: 117 U/L — ABNORMAL HIGH (ref 40–115)
BUN: 9 mg/dL (ref 7–25)
CHLORIDE: 106 mmol/L (ref 98–110)
CO2: 25 mmol/L (ref 20–31)
CREATININE: 0.74 mg/dL (ref 0.70–1.18)
Calcium: 10.3 mg/dL (ref 8.6–10.3)
GFR, Est African American: >89 mL/min (ref >=60)
GFR, Est Non African American: >89 mL/min (ref >=60)
Glucose, Bld: 111 mg/dL — ABNORMAL HIGH (ref 70–99)
Potassium: 3.8 mmol/L (ref 3.5–5.3)
SODIUM: 141 mmol/L (ref 135–146)
Total Bilirubin: 0.7 mg/dL (ref 0.2–1.2)
Total Protein: 7.2 g/dL (ref 6.1–8.1)

## 2016-02-26 LAB — CBC WITH DIFFERENTIAL/PLATELET
BASOS ABS: 48 {cells}/uL (ref 0–200)
BASOS PCT: 1 %
EOS ABS: 96 {cells}/uL (ref 15–500)
Eosinophils Relative: 2 %
HCT: 41.5 % (ref 38.5–50.0)
Hemoglobin: 13.8 g/dL (ref 13.0–17.0)
LYMPHS ABS: 1488 {cells}/uL (ref 850–3900)
LYMPHS PCT: 31 %
MCH: 28.7 pg (ref 27.0–33.0)
MCHC: 33.3 g/dL (ref 32.0–36.0)
MCV: 86.3 fL (ref 80.0–100.0)
MONO ABS: 432 {cells}/uL (ref 200–950)
MONOS PCT: 9 %
MPV: 10 fL (ref 7.5–12.5)
NEUTROS ABS: 2736 {cells}/uL (ref 1500–7800)
Neutrophils Relative %: 57 %
PLATELETS: 235 10*3/uL (ref 140–400)
RBC: 4.81 MIL/uL (ref 4.20–5.80)
RDW: 14.1 % (ref 11.0–15.0)
WBC: 4.8 10*3/uL (ref 3.8–10.8)

## 2016-02-26 LAB — LIPID PANEL
Cholesterol: 157 mg/dL (ref 125–200)
HDL: 42 mg/dL (ref >=40)
LDL CALC: 94 mg/dL (ref <130)
TRIGLYCERIDES: 105 mg/dL (ref <150)
Total CHOL/HDL Ratio: 3.7 Ratio (ref <=5.0)
VLDL: 21 mg/dL (ref <30)

## 2016-02-26 NOTE — Progress Notes (Signed)
Subjective:    Patient ID: Nicholas Williams, male    DOB: 10-Jun-1941, 75 y.o.   MRN: [id number]  HPI Patient is here today for a general checkup. He has a history of hypertension, COPD and hyperparathyroidism. He is overdue for lab work to monitor his calcium. His blood pressure today is borderline. However he has not had any of his 3 blood pressure medication this morning because he is fasting. He denies any chest pain or dyspnea on exertion. He does have some underlying shortness of breath attributed to his COPD that is chronic and unchanged. He is consistent with taking his anoro.  He has not had any recent COPD exacerbations requiring steroids. He denies any hemoptysis. He denies any productive sputum. On examination today he does have some faint posterior left basilar crackles. I recommended a chest x-ray however the patient states he does not think anything is wrong. He assumes it could be scar tissue from one of his previous bouts of pneumonia which is possible. I strongly recommended a chest x-ray but he would like to hold off at the present time Past Medical History  Diagnosis Date  . COPD (chronic obstructive pulmonary disease) ([place])   . Emphysema of lung ([place])   . Hypertension   . Arthritis   . Hyperparathyroidism, primary [facility])    Past Surgical History  Procedure Laterality Date  . Hernia repair  1954  . Appendectomy  1956  . Rotator cuff repair  11/2011   Current Outpatient Prescriptions on File Prior to Visit  Medication Sig Dispense Refill  . amLODipine (NORVASC) 5 MG tablet TAKE ONE TABLET BY MOUTH DAILY. 30 tablet 3  . ANORO ELLIPTA 62.5-25 MCG/INH AEPB INHALE ONE PUFF DAILY. 60 each 11  . aspirin 81 MG tablet Take 81 mg by mouth daily.    Marland Kitchen EPINEPHrine (EPIPEN 2-PAK) 0.3 mg/0.3 mL DEVI Inject 0.3 mLs (0.3 mg total) into the muscle once. 1 Device 2  . metoprolol (LOPRESSOR) 50 MG tablet TAKE ONE TABLET BY MOUTH TWICE DAILY. 60 tablet 3  . valsartan (DIOVAN) 320 MG tablet  Take 320 mg by mouth daily.     No current facility-administered medications on file prior to visit.   No Known Allergies Social History   Social History  . Marital Status: Married    Spouse Name: N/A  . Number of Children: N/A  . Years of Education: N/A   Occupational History  . Not on file.   Social History Main Topics  . Smoking status: Former Smoker    Types: Cigarettes  . Smokeless tobacco: Former Systems developer    Quit date: 11/14/1993     Comment: 3 packs a day  . Alcohol Use: Yes     Comment: 3-4 beers a day prn  . Drug Use: No  . Sexual Activity: Not on file   Other Topics Concern  . Not on file   Social History Narrative     Review of Systems  All other systems reviewed and are negative.      Objective:   Physical Exam  Neck: No JVD present.  Cardiovascular: Normal rate, regular rhythm and normal heart sounds.   Pulmonary/Chest: Effort normal. No respiratory distress. He has no wheezes. He has rales.  Abdominal: Soft. Bowel sounds are normal. He exhibits no distension and no mass. There is no tenderness. There is no rebound and no guarding.  Musculoskeletal: He exhibits no edema.  Vitals reviewed.         Assessment &  Plan:  Essential hypertension - Plan: CBC with Differential/Platelet, COMPLETE METABOLIC PANEL WITH GFR, Lipid panel  Chronic obstructive pulmonary disease, unspecified COPD, unspecified chronic bronchitis type  Prostate cancer screening - Plan: PSA  Blood pressures borderline however the patient is not on his medication. Continue his current medication at the present dosages. His COPD is stable. I will check a fasting lipid panel. His goal LDL cholesterol is less than 130. I would like him to continue an 81 mg aspirin. I will also check his calcium. If elevated, I would recommend referral to an endocrinologist to discuss possible parathyroidectomy. Unfortunately the patient is extremely busy caring for his wife who has dementia and is also  recently broke her hip. Therefore he is going be very hesitant to have any surgery unless absolutely necessary due to his responsibilities and commitments caring for her

## 2016-02-27 LAB — PSA: PSA: 2.62 ng/mL (ref <=4.00)

## 2016-02-29 ENCOUNTER — Encounter: Payer: Self-pay | Admitting: Family Medicine

## 2016-07-05 ENCOUNTER — Telehealth: Payer: Self-pay | Admitting: Family Medicine

## 2016-07-05 NOTE — Telephone Encounter (Signed)
Patient called he states he tried calling you yesterday. He said that the other nurse couldn't really help him. He would like to talk to you about what has been going on with him.   His wife passed away in 2023/04/14.  CB# [phone]

## 2016-07-06 NOTE — Telephone Encounter (Signed)
Called and spoke to pt and he just wanted to let us know about Carmilia his wife passing away in May and that he would be changing pharmacies to family pharmacy in [place] in [place].

## 2016-07-15 ENCOUNTER — Other Ambulatory Visit: Payer: Self-pay | Admitting: *Deleted

## 2016-07-15 MED ORDER — METOPROLOL TARTRATE 50 MG PO TABS: 50.0000 mg | ORAL_TABLET | Freq: Two times a day (BID) | ORAL | 6 refills | Status: DC

## 2016-07-15 MED ORDER — AMLODIPINE BESYLATE 5 MG PO TABS: 5.0000 mg | ORAL_TABLET | Freq: Every day | ORAL | 6 refills | Status: DC

## 2016-07-15 NOTE — Telephone Encounter (Signed)
Received fax refill request from [facility]

## 2016-08-15 ENCOUNTER — Other Ambulatory Visit: Payer: Self-pay | Admitting: Family Medicine

## 2016-09-22 ENCOUNTER — Ambulatory Visit (INDEPENDENT_AMBULATORY_CARE_PROVIDER_SITE_OTHER): Payer: Medicare Other | Admitting: Family Medicine

## 2016-09-22 ENCOUNTER — Encounter: Payer: Self-pay | Admitting: Family Medicine

## 2016-09-22 VITALS — BP 160/90 | HR 88 | Temp 97.7°F | Resp 22 | Ht 66.0 in | Wt 174.0 lb

## 2016-09-22 DIAGNOSIS — J189 Pneumonia, unspecified organism: Secondary | ICD-10-CM

## 2016-09-22 DIAGNOSIS — I10 Essential (primary) hypertension: Secondary | ICD-10-CM

## 2016-09-22 DIAGNOSIS — J181 Lobar pneumonia, unspecified organism: Secondary | ICD-10-CM

## 2016-09-22 DIAGNOSIS — J811 Chronic pulmonary edema: Secondary | ICD-10-CM | POA: Diagnosis not present

## 2016-09-22 LAB — CBC WITH DIFFERENTIAL/PLATELET
BASOS ABS: 49 {cells}/uL (ref 0–200)
Basophils Relative: 1 %
EOS ABS: 49 {cells}/uL (ref 15–500)
Eosinophils Relative: 1 %
HEMATOCRIT: 41.5 % (ref 38.5–50.0)
HEMOGLOBIN: 13.7 g/dL (ref 13.0–17.0)
LYMPHS ABS: 1519 {cells}/uL (ref 850–3900)
Lymphocytes Relative: 31 %
MCH: 28.5 pg (ref 27.0–33.0)
MCHC: 33 g/dL (ref 32.0–36.0)
MCV: 86.3 fL (ref 80.0–100.0)
MPV: 9.5 fL (ref 7.5–12.5)
Monocytes Absolute: 343 cells/uL (ref 200–950)
Monocytes Relative: 7 %
NEUTROS ABS: 2940 {cells}/uL (ref 1500–7800)
NEUTROS PCT: 60 %
Platelets: 277 10*3/uL (ref 140–400)
RBC: 4.81 MIL/uL (ref 4.20–5.80)
RDW: 13.5 % (ref 11.0–15.0)
WBC: 4.9 10*3/uL (ref 3.8–10.8)

## 2016-09-22 LAB — COMPLETE METABOLIC PANEL WITH GFR
ALBUMIN: 4.1 g/dL (ref 3.6–5.1)
ALK PHOS: 105 U/L (ref 40–115)
ALT: 13 U/L (ref 9–46)
AST: 15 U/L (ref 10–35)
BILIRUBIN TOTAL: 0.7 mg/dL (ref 0.2–1.2)
BUN: 7 mg/dL (ref 7–25)
CALCIUM: 10.2 mg/dL (ref 8.6–10.3)
CO2: 25 mmol/L (ref 20–31)
Chloride: 106 mmol/L (ref 98–110)
Creat: 0.81 mg/dL (ref 0.70–1.18)
GFR, EST NON AFRICAN AMERICAN: 86 mL/min (ref >=60)
GLUCOSE: 111 mg/dL — AB (ref 70–99)
Potassium: 3.4 mmol/L — ABNORMAL LOW (ref 3.5–5.3)
Sodium: 141 mmol/L (ref 135–146)
TOTAL PROTEIN: 7.2 g/dL (ref 6.1–8.1)

## 2016-09-22 MED ORDER — BENZONATATE 200 MG PO CAPS: 200.0000 mg | ORAL_CAPSULE | Freq: Three times a day (TID) | ORAL | 0 refills | Status: DC

## 2016-09-22 MED ORDER — LEVOFLOXACIN 500 MG PO TABS: 500.0000 mg | ORAL_TABLET | Freq: Every day | ORAL | 0 refills | Status: DC

## 2016-09-22 MED ORDER — PREDNISONE 20 MG PO TABS: ORAL_TABLET | ORAL | 0 refills | Status: DC

## 2016-09-22 MED ORDER — CEFTRIAXONE SODIUM 1 G IJ SOLR
1.0000 g | Freq: Once | INTRAMUSCULAR | Status: AC
  Administered 2016-09-22: 1 g via INTRAMUSCULAR

## 2016-09-22 NOTE — Progress Notes (Signed)
Subjective:    Patient ID: Nicholas Williams, male    DOB: Apr 26, 1941, 75 y.o.   MRN: [id number]  HPI Patient symptoms began with a cold 4 weeks ago. Since that time her cough has gradually worsened. He now has a cough productive of white and yellow sputum, worsening shortness of breath, worsening dyspnea on exertion, worsening wheezing. He reports subjective fevers and chills. On examination today he has diminished breath sounds bilaterally with expiratory wheezing and a prominent left basilar crackles and rails concerning for left lower lobe pneumonia. His blood pressure significantly elevated. Pulse oximetry is 95% on room air Past Medical History:  Diagnosis Date  . Arthritis   . COPD (chronic obstructive pulmonary disease) ([place])   . Emphysema of lung ([place])   . Hyperparathyroidism, primary ([place])   . Hypertension   . Prediabetes    Past Surgical History:  Procedure Laterality Date  . APPENDECTOMY  1956  . [place] [employer]  . ROTATOR CUFF REPAIR  11/2011   Current Outpatient Prescriptions on File Prior to Visit  Medication Sig Dispense Refill  . amLODipine (NORVASC) 5 MG tablet Take 1 tablet (5 mg total) by mouth daily. 30 tablet 6  . ANORO ELLIPTA 62.5-25 MCG/INH AEPB INHALE ONE PUFF DAILY 60 each 4  . aspirin 81 MG tablet Take 81 mg by mouth daily.    Marland Kitchen EPINEPHrine (EPIPEN 2-PAK) 0.3 mg/0.3 mL DEVI Inject 0.3 mLs (0.3 mg total) into the muscle once. 1 Device 2  . metoprolol (LOPRESSOR) 50 MG tablet Take 1 tablet (50 mg total) by mouth 2 (two) times daily. 60 tablet 6  . valsartan (DIOVAN) 320 MG tablet Take 320 mg by mouth daily.     No current facility-administered medications on file prior to visit.    No Known Allergies Social History   Social History  . Marital status: Married    Spouse name: N/A  . Number of children: N/A  . Years of education: N/A   Occupational History  . Not on file.   Social History Main Topics  . Smoking status: Former Smoker    Types:  Cigarettes  . Smokeless tobacco: Former Systems developer    Quit date: 11/14/1993     Comment: 3 packs a day  . Alcohol use Yes     Comment: 3-4 beers a day prn  . Drug use: No  . Sexual activity: Not on file   Other Topics Concern  . Not on file   Social History Narrative  . No narrative on file      Review of Systems  All other systems reviewed and are negative.      Objective:   Physical Exam  Constitutional: He appears well-developed and well-nourished.  Neck: Neck supple. No JVD present.  Cardiovascular: Normal rate, regular rhythm and normal heart sounds.   Pulmonary/Chest: He has wheezes. He has rales.  Musculoskeletal: He exhibits no edema.  Vitals reviewed.         Assessment & Plan:  Community acquired pneumonia of left lower lobe of lung ([place]) - Plan: levofloxacin (LEVAQUIN) 500 MG tablet, predniSONE (DELTASONE) 20 MG tablet, CBC with Differential/Platelet, COMPLETE METABOLIC PANEL WITH GFR  Essential hypertension  Begin Levaquin 500 mg by mouth daily for 7 days. Patient will receive 1 g of Rocephin IM 1 now. I'll also check a CBC and a CMP. Given his wheezing, I'll treat him with a prednisone taper pack as he ALSO appears to have COPD exacerbation. I've asked him to  use albuterol 2 puffs inhaled every 4-6 hours. Recheck in one week or immediately should he worsen

## 2016-09-22 NOTE — Addendum Note (Signed)
Addended by: Shary Decamp B on: 09/22/2016 11:58 AM   Modules accepted: Orders

## 2016-10-04 ENCOUNTER — Ambulatory Visit (HOSPITAL_COMMUNITY)
Admission: RE | Admit: 2016-10-04 | Discharge: 2016-10-04 | Disposition: A | Discharge status: Home / Self Care | Payer: Medicare Other | Source: Ambulatory Visit | Attending: Family Medicine | Admitting: Family Medicine

## 2016-10-04 ENCOUNTER — Ambulatory Visit (INDEPENDENT_AMBULATORY_CARE_PROVIDER_SITE_OTHER): Payer: Medicare Other | Admitting: Family Medicine

## 2016-10-04 ENCOUNTER — Encounter: Payer: Self-pay | Admitting: Family Medicine

## 2016-10-04 VITALS — BP 156/98 | HR 58 | Temp 97.6°F | Resp 20 | Wt 178.0 lb

## 2016-10-04 DIAGNOSIS — J181 Lobar pneumonia, unspecified organism: Secondary | ICD-10-CM | POA: Diagnosis not present

## 2016-10-04 DIAGNOSIS — I10 Essential (primary) hypertension: Secondary | ICD-10-CM

## 2016-10-04 DIAGNOSIS — J189 Pneumonia, unspecified organism: Secondary | ICD-10-CM

## 2016-10-04 MED ORDER — AMLODIPINE BESYLATE 10 MG PO TABS: 10.0000 mg | ORAL_TABLET | Freq: Every day | ORAL | 3 refills | Status: DC

## 2016-10-04 NOTE — Progress Notes (Signed)
Subjective:    Patient ID: Nicholas Williams, male    DOB: 29-Nov-1940, 75 y.o.   MRN: [id number]  HPI  09/23/16 Patient symptoms began with a cold 4 weeks ago. Since that time her cough has gradually worsened. He now has a cough productive of white and yellow sputum, worsening shortness of breath, worsening dyspnea on exertion, worsening wheezing. He reports subjective fevers and chills. On examination today he has diminished breath sounds bilaterally with expiratory wheezing and a prominent left basilar crackles and rails concerning for left lower lobe pneumonia. His blood pressure significantly elevated. Pulse oximetry is 95% on room air.  At that time, my plan was: Begin Levaquin 500 mg by mouth daily for 7 days. Patient will receive 1 g of Rocephin IM 1 now. I'll also check a CBC and a CMP. Given his wheezing, I'll treat him with a prednisone taper pack as he ALSO appears to have COPD exacerbation. I've asked him to use albuterol 2 puffs inhaled every 4-6 hours. Recheck in one week or immediately should he worsen  10/04/16 Patient states that he feels much better. He is more than 50% better however on his exam he still has prominent left basilar crackles. The wheezing has improved and has resolved. He denies any hemoptysis. He denies any fever. His blood pressure is elevated today and his heart rate is slightly low Past Medical History:  Diagnosis Date  . Arthritis   . COPD (chronic obstructive pulmonary disease) ([place])   . Emphysema of lung ([place])   . Hyperparathyroidism, primary ([place])   . Hypertension   . Prediabetes    Past Surgical History:  Procedure Laterality Date  . APPENDECTOMY  1956  . [place]  . ROTATOR CUFF REPAIR  11/2011   Current Outpatient Prescriptions on File Prior to Visit  Medication Sig Dispense Refill  . amLODipine (NORVASC) 5 MG tablet Take 1 tablet (5 mg total) by mouth daily. 30 tablet 6  . ANORO ELLIPTA 62.5-25 MCG/INH AEPB INHALE ONE PUFF DAILY 60  each 4  . aspirin 81 MG tablet Take 81 mg by mouth daily.    Marland Kitchen EPINEPHrine (EPIPEN 2-PAK) 0.3 mg/0.3 mL DEVI Inject 0.3 mLs (0.3 mg total) into the muscle once. 1 Device 2  . metoprolol (LOPRESSOR) 50 MG tablet Take 1 tablet (50 mg total) by mouth 2 (two) times daily. 60 tablet 6  . valsartan (DIOVAN) 320 MG tablet Take 320 mg by mouth daily.    . benzonatate (TESSALON) 200 MG capsule Take 1 capsule (200 mg total) by mouth every 8 (eight) hours. (Patient not taking: Reported on 10/04/2016) 30 capsule 0   No current facility-administered medications on file prior to visit.    No Known Allergies Social History   Social History  . Marital status: Married    Spouse name: N/A  . Number of children: N/A  . Years of education: N/A   Occupational History  . Not on file.   Social History Main Topics  . Smoking status: Former Smoker    Types: Cigarettes  . Smokeless tobacco: Former Systems developer    Quit date: 11/14/1993     Comment: 3 packs a day  . Alcohol use Yes     Comment: 3-4 beers a day prn  . Drug use: No  . Sexual activity: Not on file   Other Topics Concern  . Not on file   Social History Narrative  . No narrative on file      Review  of Systems  All other systems reviewed and are negative.      Objective:   Physical Exam  Constitutional: He appears well-developed and well-nourished.  Neck: Neck supple. No JVD present.  Cardiovascular: Normal rate, regular rhythm and normal heart sounds.   Pulmonary/Chest: He has no wheezes. He has rales.  Musculoskeletal: He exhibits no edema.  Vitals reviewed.         Assessment & Plan:  Community acquired pneumonia of left lower lobe of lung ([place]) - Plan: DG Chest 2 View  Benign essential HTN - Plan: amLODipine (NORVASC) 10 MG tablet  Given the persistent abnormal pulmonary findings, I will order a chest x-ray to evaluate the left lower lobe to determine if there is residual pneumonia versus atelectasis versus edema. Meanwhile  increase amlodipine to 10 mg a day to address his elevated blood pressure and decrease metoprolol to 25 mg twice a day due to the bradycardia

## 2016-10-20 ENCOUNTER — Telehealth: Payer: Self-pay | Admitting: Family Medicine

## 2016-10-20 NOTE — Telephone Encounter (Signed)
Pt calling about his blood pressure readings and states that his BP today was 165/93 and pulse was 68. He states that his BP has been in that same range and wanted to know if you want to change/add any medications??

## 2016-10-20 NOTE — Telephone Encounter (Signed)
Add hctz 25 mg poqday

## 2016-10-21 MED ORDER — HYDROCHLOROTHIAZIDE 25 MG PO TABS: 25.0000 mg | ORAL_TABLET | Freq: Every day | ORAL | 3 refills | Status: DC

## 2016-10-21 NOTE — Telephone Encounter (Signed)
Patient aware of providers recommendations via vm and med sent to pharm 

## 2016-11-17 ENCOUNTER — Other Ambulatory Visit: Payer: Self-pay | Admitting: Family Medicine

## 2017-03-31 ENCOUNTER — Other Ambulatory Visit: Payer: Self-pay | Admitting: Family Medicine

## 2017-03-31 MED ORDER — UMECLIDINIUM-VILANTEROL 62.5-25 MCG/INH IN AEPB: INHALATION_SPRAY | RESPIRATORY_TRACT | 4 refills | Status: DC

## 2017-04-11 ENCOUNTER — Telehealth: Payer: Self-pay | Admitting: Family Medicine

## 2017-04-11 MED ORDER — PREDNISONE 20 MG PO TABS: ORAL_TABLET | ORAL | 0 refills | Status: DC

## 2017-04-11 NOTE — Telephone Encounter (Signed)
Try prednisone taper pack, ntbs if no better

## 2017-04-11 NOTE — Telephone Encounter (Signed)
Pt wanted to know if you could call him in something for his sciatica? He is having pain when standing and walking that shoots up and down his left leg to his hip. Feels better while sitting or lying down.

## 2017-04-11 NOTE — Telephone Encounter (Signed)
Medication called/sent to requested pharmacy and pt aware 

## 2017-04-24 ENCOUNTER — Ambulatory Visit (HOSPITAL_COMMUNITY)
Admission: RE | Admit: 2017-04-24 | Discharge: 2017-04-24 | Disposition: A | Discharge status: Home / Self Care | Payer: Medicare Other | Source: Ambulatory Visit | Attending: Family Medicine | Admitting: Family Medicine

## 2017-04-24 ENCOUNTER — Ambulatory Visit (INDEPENDENT_AMBULATORY_CARE_PROVIDER_SITE_OTHER): Payer: Medicare Other | Admitting: Family Medicine

## 2017-04-24 ENCOUNTER — Encounter: Payer: Self-pay | Admitting: Family Medicine

## 2017-04-24 VITALS — BP 136/84 | HR 86 | Temp 97.8°F | Resp 18 | Ht 66.0 in | Wt 159.0 lb

## 2017-04-24 DIAGNOSIS — I700 Atherosclerosis of aorta: Secondary | ICD-10-CM | POA: Insufficient documentation

## 2017-04-24 DIAGNOSIS — M47816 Spondylosis without myelopathy or radiculopathy, lumbar region: Secondary | ICD-10-CM | POA: Diagnosis not present

## 2017-04-24 DIAGNOSIS — M5431 Sciatica, right side: Secondary | ICD-10-CM | POA: Insufficient documentation

## 2017-04-24 DIAGNOSIS — M4186 Other forms of scoliosis, lumbar region: Secondary | ICD-10-CM | POA: Diagnosis not present

## 2017-04-24 MED ORDER — HYDROMORPHONE HCL 2 MG PO TABS: 2.0000 mg | ORAL_TABLET | ORAL | 0 refills | Status: DC | PRN

## 2017-04-24 MED ORDER — PREDNISONE 20 MG PO TABS: ORAL_TABLET | ORAL | 0 refills | Status: DC

## 2017-04-24 NOTE — Progress Notes (Signed)
Subjective:    Patient ID: Nicholas Williams, male    DOB: 1941/06/26, 76 y.o.   MRN: [id number]  HPI Patient has been reporting severe pain radiating from his right posterior hip down his leg into his right shin and into his right foot for more than 6 weeks. The pain has been gradually increasing. He is tried outpatient therapy including a prednisone taper pack with no relief. He is tried oxycodone with no relief. Even with rest and inactivity over the last 6 weeks the pain is intensifying. Here today on exam, he has full internal and external rotation of the right hip with no pain. He has full flexion and extension of the right hip with no pain. He has normal range of motion in the right knee with no pain. He has a positive right straight leg raise. He has diminished ankle jerk reflex on the right. He has normal patellar reflexes. He denies any numbness in his foot but he does report tingling in his anterior leg. There are no palpable abnormalities on exam. Past Medical History:  Diagnosis Date  . Arthritis   . COPD (chronic obstructive pulmonary disease) ([place])   . Emphysema of lung ([place])   . Hyperparathyroidism, primary ([place])   . Hypertension   . Prediabetes    Past Surgical History:  Procedure Laterality Date  . APPENDECTOMY  1956  . [place]  . ROTATOR CUFF REPAIR  11/2011   Current Outpatient Prescriptions on File Prior to Visit  Medication Sig Dispense Refill  . amLODipine (NORVASC) 10 MG tablet Take 1 tablet (10 mg total) by mouth daily. 90 tablet 3  . amLODipine (NORVASC) 5 MG tablet Take 1 tablet (5 mg total) by mouth daily. 30 tablet 6  . aspirin 81 MG tablet Take 81 mg by mouth daily.    Marland Kitchen EPINEPHrine (EPIPEN 2-PAK) 0.3 mg/0.3 mL DEVI Inject 0.3 mLs (0.3 mg total) into the muscle once. 1 Device 2  . hydrochlorothiazide (HYDRODIURIL) 25 MG tablet Take 1 tablet (25 mg total) by mouth daily. 90 tablet 3  . losartan (COZAAR) 100 MG tablet TAKE 1 TABLET BY MOUTH EVERY DAY  30 tablet 11  . metoprolol (LOPRESSOR) 50 MG tablet Take 1 tablet (50 mg total) by mouth 2 (two) times daily. 60 tablet 6  . predniSONE (DELTASONE) 20 MG tablet 3 tabs po qd x 2 days, 2 tabs po qd x 2 days, 1 tab po qd x 2 days 12 tablet 0  . umeclidinium-vilanterol (ANORO ELLIPTA) 62.5-25 MCG/INH AEPB INHALE ONE PUFF DAILY 60 each 4  . valsartan (DIOVAN) 320 MG tablet Take 320 mg by mouth daily.    . benzonatate (TESSALON) 200 MG capsule Take 1 capsule (200 mg total) by mouth every 8 (eight) hours. (Patient not taking: Reported on 10/04/2016) 30 capsule 0   No current facility-administered medications on file prior to visit.    No Known Allergies Social History   Social History  . Marital status: Married    Spouse name: N/A  . Number of children: N/A  . Years of education: N/A   Occupational History  . Not on file.   Social History Main Topics  . Smoking status: Former Smoker    Types: Cigarettes  . Smokeless tobacco: Former Systems developer    Quit date: 11/14/1993     Comment: 3 packs a day  . Alcohol use Yes     Comment: 3-4 beers a day prn  . Drug use: No  .  Sexual activity: Not on file   Other Topics Concern  . Not on file   Social History Narrative  . No narrative on file      Review of Systems  All other systems reviewed and are negative.      Objective:   Physical Exam  Cardiovascular: Normal rate, regular rhythm and normal heart sounds.   Pulmonary/Chest: Effort normal. He has wheezes.  Musculoskeletal:       Right hip: He exhibits normal range of motion, normal strength and no tenderness.       Right knee: He exhibits normal range of motion. No tenderness found.       Lumbar back: He exhibits decreased range of motion and tenderness. He exhibits no pain.       Legs: Vitals reviewed.   The radiation of the pain as outlined in red on the diagram      Assessment & Plan:  Right sided sciatica - Plan: HYDROmorphone (DILAUDID) 2 MG tablet, predniSONE (DELTASONE)  20 MG tablet, DG Lumbar Spine Complete, BASIC METABOLIC PANEL WITH GFR, CBC with Differential/Platelet, MR Lumbar Spine Wo Contrast  Symptoms are consistent with right-sided sciatica. Symptoms of them present for greater than 6 weeks and are getting worse despite conservative therapy with prednisone and oxycodone. He even took 10 mg of oxycodone every 4 hours with no relief. I will try to get the patient scheduled for an MRI as quickly as possible to see if he's a candidate for an epidural steroid injection. We'll try an additional round of prednisone taper pack. Obtain x-ray of the lumbar spine to rule out fracture in the back or occult malignancy. Begin Dilaudid 2 mg every 6 hours as needed. Caution the patient about respiratory depression given his age but he is failing milder pain medication

## 2017-04-25 LAB — CBC WITH DIFFERENTIAL/PLATELET
Basophils Absolute: 79 cells/uL (ref 0–200)
Basophils Relative: 1 %
EOS PCT: 0 %
Eosinophils Absolute: 0 cells/uL — ABNORMAL LOW (ref 15–500)
HEMATOCRIT: 38.7 % (ref 38.5–50.0)
Hemoglobin: 12.4 g/dL — ABNORMAL LOW (ref 13.0–17.0)
LYMPHS PCT: 22 %
Lymphs Abs: 1738 cells/uL (ref 850–3900)
MCH: 25.3 pg — ABNORMAL LOW (ref 27.0–33.0)
MCHC: 32 g/dL (ref 32.0–36.0)
MCV: 79 fL — ABNORMAL LOW (ref 80.0–100.0)
MPV: 10 fL (ref 7.5–12.5)
Monocytes Absolute: 711 cells/uL (ref 200–950)
Monocytes Relative: 9 %
NEUTROS PCT: 68 %
Neutro Abs: 5372 cells/uL (ref 1500–7800)
Platelets: 356 10*3/uL (ref 140–400)
RBC: 4.9 MIL/uL (ref 4.20–5.80)
RDW: 14.4 % (ref 11.0–15.0)
WBC: 7.9 10*3/uL (ref 3.8–10.8)

## 2017-04-25 LAB — BASIC METABOLIC PANEL WITH GFR
BUN: 16 mg/dL (ref 7–25)
CALCIUM: 10.9 mg/dL — AB (ref 8.6–10.3)
CO2: 24 mmol/L (ref 20–31)
CREATININE: 0.89 mg/dL (ref 0.70–1.18)
Chloride: 101 mmol/L (ref 98–110)
GFR, Est Non African American: 82 mL/min (ref >=60)
Glucose, Bld: 100 mg/dL — ABNORMAL HIGH (ref 70–99)
Potassium: 3.6 mmol/L (ref 3.5–5.3)
Sodium: 138 mmol/L (ref 135–146)

## 2017-04-28 ENCOUNTER — Ambulatory Visit (HOSPITAL_COMMUNITY)
Admission: RE | Admit: 2017-04-28 | Discharge: 2017-04-28 | Disposition: A | Discharge status: Home / Self Care | Payer: Medicare Other | Source: Ambulatory Visit | Attending: Family Medicine | Admitting: Family Medicine

## 2017-04-28 ENCOUNTER — Ambulatory Visit (HOSPITAL_COMMUNITY): Payer: Medicare Other

## 2017-04-28 DIAGNOSIS — M5431 Sciatica, right side: Secondary | ICD-10-CM | POA: Diagnosis not present

## 2017-04-28 DIAGNOSIS — M5136 Other intervertebral disc degeneration, lumbar region: Secondary | ICD-10-CM | POA: Diagnosis not present

## 2017-04-28 DIAGNOSIS — M48061 Spinal stenosis, lumbar region without neurogenic claudication: Secondary | ICD-10-CM | POA: Diagnosis not present

## 2017-04-28 DIAGNOSIS — M545 Low back pain: Secondary | ICD-10-CM | POA: Diagnosis not present

## 2017-05-01 ENCOUNTER — Other Ambulatory Visit: Payer: Self-pay | Admitting: Family Medicine

## 2017-05-01 DIAGNOSIS — G588 Other specified mononeuropathies: Secondary | ICD-10-CM

## 2017-05-02 ENCOUNTER — Other Ambulatory Visit: Payer: Self-pay | Admitting: Family Medicine

## 2017-05-02 DIAGNOSIS — G588 Other specified mononeuropathies: Secondary | ICD-10-CM

## 2017-05-02 MED ORDER — METOPROLOL TARTRATE 50 MG PO TABS: 50.0000 mg | ORAL_TABLET | Freq: Two times a day (BID) | ORAL | 11 refills | Status: DC

## 2017-05-03 ENCOUNTER — Ambulatory Visit
Admission: RE | Admit: 2017-05-03 | Discharge: 2017-05-03 | Disposition: A | Discharge status: Home / Self Care | Payer: Medicare Other | Source: Ambulatory Visit | Attending: Family Medicine | Admitting: Family Medicine

## 2017-05-03 DIAGNOSIS — M47817 Spondylosis without myelopathy or radiculopathy, lumbosacral region: Secondary | ICD-10-CM | POA: Diagnosis not present

## 2017-05-03 DIAGNOSIS — G588 Other specified mononeuropathies: Secondary | ICD-10-CM

## 2017-05-03 MED ORDER — IOPAMIDOL (ISOVUE-M 200) INJECTION 41%
1.0000 mL | Freq: Once | INTRAMUSCULAR | Status: AC
  Administered 2017-05-03: 1 mL via EPIDURAL

## 2017-05-03 MED ORDER — METHYLPREDNISOLONE ACETATE 40 MG/ML INJ SUSP (RADIOLOG
120.0000 mg | Freq: Once | INTRAMUSCULAR | Status: AC
  Administered 2017-05-03: 120 mg via EPIDURAL

## 2017-05-03 NOTE — Discharge Instructions (Signed)

## 2017-05-09 ENCOUNTER — Telehealth: Payer: Self-pay | Admitting: Family Medicine

## 2017-05-09 ENCOUNTER — Other Ambulatory Visit: Payer: Medicare Other

## 2017-05-09 DIAGNOSIS — M545 Low back pain: Secondary | ICD-10-CM

## 2017-05-09 NOTE — Telephone Encounter (Signed)
Recommend neurosurgery or ortho spine - either one is fine See if he has seen anyone in the past.   He has forminal stenosis, pinched nerve lumbar spine, DDD lumbar spine He can return to the oxycodone in the meantime

## 2017-05-09 NOTE — Telephone Encounter (Signed)
Pt called and states that he had the ESI last Wednesday and it has not helped at all. He is still in a lot of pain and can not tolerate the pain med as it constipates him too bad and was wondering should her go for another ESI or does he need to see a specialist? MD Please advise.

## 2017-05-10 NOTE — Telephone Encounter (Signed)
Spoke to pt and is aware of recommendations and would like for Dr. Dennard Schaumann to see note and give his opinion. He means no disrespect but would like [facility]'s opinion on this before proceeding.

## 2017-05-15 NOTE — Telephone Encounter (Signed)
Patient aware of providers recommendations. He can get another injection - sometimes it takes 2-3 injections to see benefits. He will call and set up appt to have another injection.

## 2017-05-15 NOTE — Telephone Encounter (Signed)
I would definitely consult neurosurgery.  Meanwhile, he can take samples of movantik for hte constipation which should stop that so he can take the pain med.

## 2017-05-16 ENCOUNTER — Other Ambulatory Visit: Payer: Self-pay | Admitting: Family Medicine

## 2017-05-16 DIAGNOSIS — G8929 Other chronic pain: Secondary | ICD-10-CM

## 2017-05-16 DIAGNOSIS — M545 Low back pain: Principal | ICD-10-CM

## 2017-05-24 ENCOUNTER — Other Ambulatory Visit: Payer: Self-pay | Admitting: *Deleted

## 2017-05-24 MED ORDER — LOSARTAN POTASSIUM 100 MG PO TABS: 100.0000 mg | ORAL_TABLET | Freq: Every day | ORAL | 3 refills | Status: DC

## 2017-05-31 ENCOUNTER — Ambulatory Visit
Admission: RE | Admit: 2017-05-31 | Discharge: 2017-05-31 | Disposition: A | Discharge status: Home / Self Care | Payer: Medicare Other | Source: Ambulatory Visit | Attending: Family Medicine | Admitting: Family Medicine

## 2017-05-31 ENCOUNTER — Other Ambulatory Visit: Payer: Self-pay | Admitting: Family Medicine

## 2017-05-31 DIAGNOSIS — M47817 Spondylosis without myelopathy or radiculopathy, lumbosacral region: Secondary | ICD-10-CM | POA: Diagnosis not present

## 2017-05-31 DIAGNOSIS — G8929 Other chronic pain: Secondary | ICD-10-CM

## 2017-05-31 DIAGNOSIS — M545 Low back pain: Principal | ICD-10-CM

## 2017-05-31 MED ORDER — IOPAMIDOL (ISOVUE-M 200) INJECTION 41%
1.0000 mL | Freq: Once | INTRAMUSCULAR | Status: AC
  Administered 2017-05-31: 1 mL via EPIDURAL

## 2017-05-31 MED ORDER — METHYLPREDNISOLONE ACETATE 40 MG/ML INJ SUSP (RADIOLOG
120.0000 mg | Freq: Once | INTRAMUSCULAR | Status: AC
  Administered 2017-05-31: 120 mg via EPIDURAL

## 2017-08-10 ENCOUNTER — Telehealth: Payer: Self-pay | Admitting: Family Medicine

## 2017-08-10 NOTE — Telephone Encounter (Signed)
Given his age and copd, really needs to be seen.  If he just wants cough med, tessalon perles 200 mg q 8 hrs.

## 2017-08-10 NOTE — Telephone Encounter (Signed)
Pt called [facility] stating that he has a dry cough that he can not get rid of and is worried he might get PNA and wanted to know if we could call him in something? Please advise

## 2017-08-10 NOTE — Telephone Encounter (Signed)
Pt aware and has some at home will take and if no help will call for an apt.

## 2017-09-05 ENCOUNTER — Other Ambulatory Visit: Payer: Self-pay | Admitting: Family Medicine

## 2017-09-06 ENCOUNTER — Other Ambulatory Visit: Payer: Self-pay | Admitting: Family Medicine

## 2017-09-22 ENCOUNTER — Other Ambulatory Visit: Payer: Self-pay | Admitting: Family Medicine

## 2017-09-22 DIAGNOSIS — I10 Essential (primary) hypertension: Secondary | ICD-10-CM

## 2017-09-29 ENCOUNTER — Other Ambulatory Visit: Payer: Self-pay | Admitting: Family Medicine

## 2017-09-29 MED ORDER — UMECLIDINIUM-VILANTEROL 62.5-25 MCG/INH IN AEPB: INHALATION_SPRAY | RESPIRATORY_TRACT | 0 refills | Status: AC

## 2018-01-09 ENCOUNTER — Encounter: Payer: Self-pay | Admitting: Family Medicine

## 2018-01-09 ENCOUNTER — Ambulatory Visit (INDEPENDENT_AMBULATORY_CARE_PROVIDER_SITE_OTHER): Payer: Medicare Other | Admitting: Family Medicine

## 2018-01-09 VITALS — BP 120/68 | HR 87 | Temp 97.8°F | Resp 20 | Ht 66.0 in | Wt 173.0 lb

## 2018-01-09 DIAGNOSIS — R5383 Other fatigue: Secondary | ICD-10-CM | POA: Diagnosis not present

## 2018-01-09 DIAGNOSIS — R0609 Other forms of dyspnea: Secondary | ICD-10-CM | POA: Diagnosis not present

## 2018-01-09 DIAGNOSIS — D649 Anemia, unspecified: Secondary | ICD-10-CM | POA: Diagnosis not present

## 2018-01-09 DIAGNOSIS — D509 Iron deficiency anemia, unspecified: Secondary | ICD-10-CM | POA: Diagnosis not present

## 2018-01-09 DIAGNOSIS — I10 Essential (primary) hypertension: Secondary | ICD-10-CM

## 2018-01-09 DIAGNOSIS — J449 Chronic obstructive pulmonary disease, unspecified: Secondary | ICD-10-CM | POA: Diagnosis not present

## 2018-01-09 DIAGNOSIS — Z1321 Encounter for screening for nutritional disorder: Secondary | ICD-10-CM | POA: Diagnosis not present

## 2018-01-09 MED ORDER — ALBUTEROL SULFATE HFA 108 (90 BASE) MCG/ACT IN AERS: 2.0000 | INHALATION_SPRAY | Freq: Four times a day (QID) | RESPIRATORY_TRACT | 3 refills | Status: AC | PRN

## 2018-01-09 NOTE — Progress Notes (Signed)
Subjective:    Patient ID: Nicholas Williams, male    DOB: [birth year], 77 y.o.   MRN: [id number]  HPI Patient is here today requesting oxygen.  He has a history of COPD.  His wife previously was on oxygen prior to her death and she saw benefit from oxygen regarding dyspnea on exertion.  He is experiencing significant dyspnea on exertion and is interested in trying oxygen as well.  I perform pulmonary function test today in the office which indicated severe obstruction.  FEV1 was 0.91 L.  FVC was 1.93 L.  FEV1 to FVC ratio was 47% indicating obstructive lung disease.  FEV1 was 38% of predicted indicating stage III borderline stage IV COPD.  Patient is compliant with anoro which he uses daily.  However he sees very little benefit from.  In fact he is using Combivent which was his wife's prescription 6 and 7 times a day for shortness of breath.  He states that every time he does any work, he becomes extremely winded.  He denies any chest pain.  He is 94% on room air sitting.  With ambulation, his pulse oximetry actually improved to 96% which does not qualify for oxygen.  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 Past Medical History:  Diagnosis Date  . Arthritis   . COPD (chronic obstructive pulmonary disease) ([place])   . Emphysema of lung ([place])   . Hyperparathyroidism, primary ([place])   . Hypertension   . Prediabetes    Past Surgical History:  Procedure Laterality Date  . APPENDECTOMY  1956  . [place]  . ROTATOR CUFF REPAIR  11/2011   Current Outpatient Medications on File Prior to Visit  Medication Sig Dispense Refill  . amLODipine (NORVASC) 10 MG tablet TAKE 1 TABLET (10 MG TOTAL) BY MOUTH DAILY. 90 tablet 3  . aspirin 81 MG tablet Take 81 mg by mouth daily.    Marland Kitchen EPINEPHrine (EPIPEN 2-PAK) 0.3 mg/0.3 mL DEVI Inject 0.3 mLs (0.3  mg total) into the muscle once. 1 Device 2  . hydrochlorothiazide (HYDRODIURIL) 25 MG tablet TAKE 1 TABLET (25 MG TOTAL) BY MOUTH DAILY. 90 tablet 3  . losartan (COZAAR) 100 MG tablet Take 1 tablet (100 mg total) by mouth daily. 90 tablet 3  . metoprolol tartrate (LOPRESSOR) 50 MG tablet Take 1 tablet (50 mg total) by mouth 2 (two) times daily. (Patient taking differently: Take 25 mg by mouth 2 (two) times daily. ) 60 tablet 11  . umeclidinium-vilanterol (ANORO ELLIPTA) 62.5-25 MCG/INH AEPB INHALE ONE PUFF DAILY 30 each 0   No current facility-administered medications on file prior to visit.    No Known Allergies Social History   Socioeconomic History  . Marital status: Married    Spouse name: Not on file  . Number of children: Not on file  . Years of education: Not on file  . Highest education level: Not on file  Social Needs  . Financial resource strain: Not on file  . Food insecurity - worry: Not on file  . Food insecurity - inability: Not on file  . Transportation needs - medical: Not on file  . Transportation needs - non-medical: Not on file  Occupational History  . Not on file  Tobacco Use  . Smoking status: Former Smoker    Types: Cigarettes  . Smokeless tobacco: Former Systems developer  Quit date: 11/14/1993  . Tobacco comment: 3 packs a day  Substance and Sexual Activity  . Alcohol use: Yes    Comment: 3-4 beers a day prn  . Drug use: No  . Sexual activity: Not on file  Other Topics Concern  . Not on file  Social History Narrative  . Not on file      Review of Systems  All other systems reviewed and are negative.      Objective:   Physical Exam  Constitutional: He appears well-developed and well-nourished.  Neck: Neck supple. No JVD present.  Cardiovascular: Normal rate, regular rhythm and normal heart sounds.  Pulmonary/Chest: He has wheezes. He has rales.  Abdominal: Soft. Bowel sounds are normal. He exhibits no distension. There is no tenderness. There is no  rebound.  Musculoskeletal: He exhibits no edema.  Lymphadenopathy:    He has no cervical adenopathy.  Vitals reviewed.         Assessment & Plan:  Chronic obstructive pulmonary disease, unspecified COPD type ([place])  Benign essential HTN - Plan: CBC with Differential/Platelet, COMPLETE METABOLIC PANEL WITH GFR, Lipid panel  DOE (dyspnea on exertion) - Plan: ECHOCARDIOGRAM COMPLETE  Patient does not qualify for oxygen and I am concerned about his severe dyspnea on exertion indicating other lung disease other than the COPD which he apparently has on pulmonary function testing.  I have recommended a high-resolution CT scan of the chest to evaluate for interstitial lung disease.  I have also recommended an echocardiogram to evaluate for congestive heart failure/systolic dysfunction.

## 2018-01-11 LAB — CBC WITH DIFFERENTIAL/PLATELET
BASOS ABS: 70 {cells}/uL (ref 0–200)
Basophils Relative: 0.8 %
Eosinophils Absolute: 123 cells/uL (ref 15–500)
Eosinophils Relative: 1.4 %
HEMATOCRIT: 28.5 % — AB (ref 38.5–50.0)
Hemoglobin: 8.5 g/dL — ABNORMAL LOW (ref 13.2–17.1)
LYMPHS ABS: 1971 {cells}/uL (ref 850–3900)
MCH: 19.5 pg — ABNORMAL LOW (ref 27.0–33.0)
MCHC: 29.8 g/dL — ABNORMAL LOW (ref 32.0–36.0)
MCV: 65.5 fL — AB (ref 80.0–100.0)
MPV: 10.7 fL (ref 7.5–12.5)
Monocytes Relative: 9 %
NEUTROS PCT: 66.4 %
Neutro Abs: 5843 cells/uL (ref 1500–7800)
Platelets: 419 10*3/uL — ABNORMAL HIGH (ref 140–400)
RBC: 4.35 10*6/uL (ref 4.20–5.80)
RDW: 16.5 % — AB (ref 11.0–15.0)
Total Lymphocyte: 22.4 %
WBC: 8.8 10*3/uL (ref 3.8–10.8)
WBCMIX: 792 {cells}/uL (ref 200–950)

## 2018-01-11 LAB — COMPLETE METABOLIC PANEL WITH GFR
AG RATIO: 1.1 (calc) (ref 1.0–2.5)
ALBUMIN MSPROF: 4 g/dL (ref 3.6–5.1)
ALT: 10 U/L (ref 9–46)
AST: 13 U/L (ref 10–35)
Alkaline phosphatase (APISO): 118 U/L — ABNORMAL HIGH (ref 40–115)
BUN / CREAT RATIO: 12 (calc) (ref 6–22)
BUN: 14 mg/dL (ref 7–25)
CO2: 26 mmol/L (ref 20–32)
Calcium: 11.6 mg/dL — ABNORMAL HIGH (ref 8.6–10.3)
Chloride: 102 mmol/L (ref 98–110)
Creat: 1.19 mg/dL — ABNORMAL HIGH (ref 0.70–1.18)
GFR, EST AFRICAN AMERICAN: 67 mL/min/{1.73_m2} (ref >=60)
GFR, Est Non African American: 58 mL/min/{1.73_m2} — ABNORMAL LOW (ref >=60)
GLOBULIN: 3.5 g/dL (ref 1.9–3.7)
Glucose, Bld: 106 mg/dL — ABNORMAL HIGH (ref 65–99)
POTASSIUM: 4.4 mmol/L (ref 3.5–5.3)
SODIUM: 138 mmol/L (ref 135–146)
Total Bilirubin: 0.6 mg/dL (ref 0.2–1.2)
Total Protein: 7.5 g/dL (ref 6.1–8.1)

## 2018-01-11 LAB — LIPID PANEL
Cholesterol: 147 mg/dL (ref <200)
HDL: 36 mg/dL — AB (ref >40)
LDL Cholesterol (Calc): 89 mg/dL (calc)
NON-HDL CHOLESTEROL (CALC): 111 mg/dL (ref <130)
Total CHOL/HDL Ratio: 4.1 (calc) (ref <5.0)
Triglycerides: 127 mg/dL (ref <150)

## 2018-01-11 LAB — TEST AUTHORIZATION

## 2018-01-11 LAB — VITAMIN B12: Vitamin B-12: 297 pg/mL (ref 200–1100)

## 2018-01-11 LAB — IRON,TIBC AND FERRITIN PANEL
%SAT: 5 % — AB (ref 15–60)
Ferritin: 5 ng/mL — ABNORMAL LOW (ref 20–380)
IRON: 22 ug/dL — AB (ref 50–180)
TIBC: 472 ug/dL — AB (ref 250–425)

## 2018-01-11 LAB — CBC MORPHOLOGY

## 2018-01-13 DIAGNOSIS — Z1211 Encounter for screening for malignant neoplasm of colon: Secondary | ICD-10-CM | POA: Diagnosis not present

## 2018-01-14 DIAGNOSIS — Z1211 Encounter for screening for malignant neoplasm of colon: Secondary | ICD-10-CM | POA: Diagnosis not present

## 2018-01-15 DIAGNOSIS — Z1211 Encounter for screening for malignant neoplasm of colon: Secondary | ICD-10-CM | POA: Diagnosis not present

## 2018-01-16 ENCOUNTER — Ambulatory Visit (HOSPITAL_COMMUNITY)
Admission: RE | Admit: 2018-01-16 | Discharge: 2018-01-16 | Disposition: A | Discharge status: Home / Self Care | Payer: Medicare Other | Source: Ambulatory Visit | Attending: Family Medicine | Admitting: Family Medicine

## 2018-01-16 ENCOUNTER — Other Ambulatory Visit: Payer: Medicare Other

## 2018-01-16 ENCOUNTER — Other Ambulatory Visit: Payer: Self-pay

## 2018-01-16 DIAGNOSIS — Z1211 Encounter for screening for malignant neoplasm of colon: Secondary | ICD-10-CM

## 2018-01-16 DIAGNOSIS — I358 Other nonrheumatic aortic valve disorders: Secondary | ICD-10-CM | POA: Diagnosis not present

## 2018-01-16 DIAGNOSIS — R0609 Other forms of dyspnea: Secondary | ICD-10-CM | POA: Diagnosis not present

## 2018-01-16 DIAGNOSIS — J449 Chronic obstructive pulmonary disease, unspecified: Secondary | ICD-10-CM | POA: Diagnosis not present

## 2018-01-16 DIAGNOSIS — I119 Hypertensive heart disease without heart failure: Secondary | ICD-10-CM | POA: Insufficient documentation

## 2018-01-16 NOTE — Progress Notes (Signed)
*  PRELIMINARY RESULTS* Echocardiogram 2D Echocardiogram has been performed.  Nicholas Williams 01/16/2018, 2:22 PM

## 2018-01-17 LAB — FECAL GLOBIN BY IMMUNOCHEMISTRY
FECAL GLOBIN RESULT: DETECTED — AB
FECAL GLOBIN RESULT: NOT DETECTED
FECAL GLOBIN RESULT:: DETECTED — AB
MICRO NUMBER: 90282694
MICRO NUMBER:: 90282722
MICRO NUMBER:: 90282752
SPECIMEN QUALITY: ADEQUATE
SPECIMEN QUALITY:: ADEQUATE
SPECIMEN QUALITY:: ADEQUATE

## 2018-02-06 ENCOUNTER — Encounter: Payer: Self-pay | Admitting: Family Medicine

## 2018-02-06 ENCOUNTER — Ambulatory Visit (INDEPENDENT_AMBULATORY_CARE_PROVIDER_SITE_OTHER): Payer: Medicare Other | Admitting: Family Medicine

## 2018-02-06 VITALS — BP 110/68 | HR 72 | Temp 97.8°F | Resp 20 | Ht 66.0 in | Wt 167.0 lb

## 2018-02-06 DIAGNOSIS — K922 Gastrointestinal hemorrhage, unspecified: Secondary | ICD-10-CM | POA: Diagnosis not present

## 2018-02-06 DIAGNOSIS — D509 Iron deficiency anemia, unspecified: Secondary | ICD-10-CM | POA: Diagnosis not present

## 2018-02-06 DIAGNOSIS — R300 Dysuria: Secondary | ICD-10-CM | POA: Diagnosis not present

## 2018-02-06 LAB — URINALYSIS, ROUTINE W REFLEX MICROSCOPIC
BILIRUBIN URINE: NEGATIVE
GLUCOSE, UA: NEGATIVE
KETONES UR: NEGATIVE
NITRITE: NEGATIVE
PH: 6 (ref 5.0–8.0)
Specific Gravity, Urine: 1.015 (ref 1.001–1.03)
Squamous Epithelial / LPF: NONE SEEN /HPF (ref <=5)

## 2018-02-06 LAB — MICROSCOPIC MESSAGE

## 2018-02-06 MED ORDER — PANTOPRAZOLE SODIUM 40 MG PO TBEC: 40.0000 mg | DELAYED_RELEASE_TABLET | Freq: Every day | ORAL | 3 refills | Status: DC

## 2018-02-06 MED ORDER — CIPROFLOXACIN HCL 500 MG PO TABS: 500.0000 mg | ORAL_TABLET | Freq: Two times a day (BID) | ORAL | 0 refills | Status: DC

## 2018-02-06 NOTE — Progress Notes (Signed)
Subjective:    Patient ID: Nicholas Williams, male    DOB: Nov 29, 1940, 77 y.o.   MRN: [id number]  HPI  01/09/18 Patient is here today requesting oxygen.  He has a history of COPD.  His wife previously was on oxygen prior to her death and she saw benefit from oxygen regarding dyspnea on exertion.  He is experiencing significant dyspnea on exertion and is interested in trying oxygen as well.  I perform pulmonary function test today in the office which indicated severe obstruction.  FEV1 was 0.91 L.  FVC was 1.93 L.  FEV1 to FVC ratio was 47% indicating obstructive lung disease.  FEV1 was 38% of predicted indicating stage III borderline stage IV COPD.  Patient is compliant with anoro which he uses daily.  However he sees very little benefit from.  In fact he is using Combivent which was his wife's prescription 6 and 7 times a day for shortness of breath.  He states that every time he does any work, he becomes extremely winded.  He denies any chest pain.  He is 94% on room air sitting.  With ambulation, his pulse oximetry actually improved to 96% which does not qualify for oxygen.  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  At that time, my plan was: Patient does not qualify for oxygen and I am concerned about his severe dyspnea on exertion indicating other lung disease other than the COPD which he apparently has on pulmonary function testing.  I have recommended a high-resolution CT scan of the chest to evaluate for interstitial lung disease.  I have also recommended an echocardiogram to evaluate for congestive heart failure/systolic dysfunction.  02/06/18 Appointment on 01/16/2018  Component Date Value Ref Range Status  . MICRO NUMBER: 01/13/2018 [id number]   Final  . SPECIMEN QUALITY: 01/13/2018 ADEQUATE   Final  . Source: 01/13/2018 INSURE (TM) FOBT  TEST CARD   Final  . STATUS: 01/13/2018 FINAL   Final  . FECAL GLOBIN RESULT: 01/13/2018 Detected*  Final  . MICRO NUMBER: 01/14/2018 [id number]   Final  . SPECIMEN QUALITY: 01/14/2018 ADEQUATE   Final  . Source: 01/14/2018 INSURE (TM) FOBT TEST CARD   Final  . STATUS: 01/14/2018 FINAL   Final  . FECAL GLOBIN RESULT: 01/14/2018 Not Detected   Final  . MICRO NUMBER: 01/15/2018 [id number]   Final  . SPECIMEN QUALITY: 01/15/2018 ADEQUATE   Final  . Source: 01/15/2018 INSURE (TM) FOBT TEST CARD   Final  . STATUS: 01/15/2018 FINAL   Final  . FECAL GLOBIN RESULT: 01/15/2018 Detected*  Final  Office Visit on 01/09/2018  Component Date Value Ref Range Status  . WBC 01/09/2018 8.8  3.8 - 10.8 Thousand/uL Final  . RBC 01/09/2018 4.35  4.20 - 5.80 Million/uL Final  . Hemoglobin 01/09/2018 8.5* 13.2 - 17.1 g/dL Final  . HCT 01/09/2018 28.5* 38.5 - 50.0 % Final  . MCV 01/09/2018 65.5* 80.0 - 100.0 fL Final  . MCH 01/09/2018 19.5* 27.0 - 33.0 pg Final  . MCHC 01/09/2018 29.8* 32.0 - 36.0 g/dL Final  . RDW 01/09/2018 16.5* 11.0 - 15.0 % Final  . Platelets 01/09/2018 419* 140 - 400 Thousand/uL Final  . MPV 01/09/2018 10.7  7.5 - 12.5 fL Final  . Neutro Abs 01/09/2018 5,843  1,500 - 7,800 cells/uL Final  . Lymphs Abs 01/09/2018 1,971  850 - 3,900 cells/uL Final  . WBC mixed population 01/09/2018 792  200 - 950 cells/uL Final  . Eosinophils Absolute 01/09/2018 123  15 - 500 cells/uL Final  . Basophils Absolute 01/09/2018 70  0 - 200 cells/uL Final  . Neutrophils Relative % 01/09/2018 66.4  % Final  . Total Lymphocyte 01/09/2018 22.4  % Final  . Monocytes Relative 01/09/2018 9.0  % Final  . Eosinophils Relative 01/09/2018 1.4  % Final  . Basophils Relative 01/09/2018 0.8  % Final  . Glucose, Bld 01/09/2018 106* 65 - 99 mg/dL Final   Comment: .            Fasting reference interval . For someone without known diabetes, a glucose value between 100 and 125 mg/dL is consistent with prediabetes and  should be confirmed with a follow-up test. .   . BUN 01/09/2018 14  7 - 25 mg/dL Final  . Creat 01/09/2018 1.19* 0.70 - 1.18 mg/dL Final   Comment: For patients >71 years of age, the reference limit for Creatinine is approximately 13% higher for people identified as African-American. .   . GFR, Est Non African American 01/09/2018 58* > OR = 60 mL/min/1.22m2 Final  . GFR, Est African American 01/09/2018 67  > OR = 60 mL/min/1.41m2 Final  . BUN/Creatinine Ratio 01/09/2018 12  6 - 22 (calc) Final  . Sodium 01/09/2018 138  135 - 146 mmol/L Final  . Potassium 01/09/2018 4.4  3.5 - 5.3 mmol/L Final  . Chloride 01/09/2018 102  98 - 110 mmol/L Final  . CO2 01/09/2018 26  20 - 32 mmol/L Final  . Calcium 01/09/2018 11.6* 8.6 - 10.3 mg/dL Final  . Total Protein 01/09/2018 7.5  6.1 - 8.1 g/dL Final  . Albumin 01/09/2018 4.0  3.6 - 5.1 g/dL Final  . Globulin 01/09/2018 3.5  1.9 - 3.7 g/dL (calc) Final  . AG Ratio 01/09/2018 1.1  1.0 - 2.5 (calc) Final  . Total Bilirubin 01/09/2018 0.6  0.2 - 1.2 mg/dL Final  . Alkaline phosphatase (APISO) 01/09/2018 118* 40 - 115 U/L Final  . AST 01/09/2018 13  10 - 35 U/L Final  . ALT 01/09/2018 10  9 - 46 U/L Final  . Cholesterol 01/09/2018 147  <200 mg/dL Final  . HDL 01/09/2018 36* >40 mg/dL Final  . Triglycerides 01/09/2018 127  <150 mg/dL Final  . LDL Cholesterol (Calc) 01/09/2018 89  mg/dL (calc) Final   Comment: Reference range: <100 . Desirable range <100 mg/dL for primary prevention;   <70 mg/dL for patients with CHD or diabetic patients  with > or = 2 CHD risk factors. Marland Kitchen LDL-C is now calculated using the Martin-Hopkins  calculation, which is a validated novel method providing  better accuracy than the Friedewald equation in the  estimation of LDL-C.  Cresenciano Genre et al. Annamaria Helling. 7169;678(93): 2061-2068  (http://education.[facility].com/faq/FAQ164)   . Total CHOL/HDL Ratio 01/09/2018 4.1  <5.0 (calc) Final  . Non-HDL Cholesterol (Calc)  01/09/2018 111  <130 mg/dL (calc) Final   Comment: For patients with diabetes plus 1 major ASCVD risk  factor, treating to a non-HDL-C goal of <100 mg/dL  (LDL-C of <70 mg/dL) is considered a therapeutic  option.   . CBC MORPHOLOGY 01/09/2018   NORMAL Final   Comment: Elliptocytes 1 + Anisocytosis 1 + Polychromasia 1 +   . Iron 01/09/2018 22* 50 - 180 mcg/dL Final  . TIBC 01/09/2018 472* 250 - 425 mcg/dL (calc) Final  . %SAT 01/09/2018 5* 15 -  60 % (calc) Final  . Ferritin 01/09/2018 5* 20 - 380 ng/mL Final  . Vitamin B-12 01/09/2018 297  200 - 1,100 pg/mL Final   Comment: . Please Note: Although the reference range for vitamin B12 is [phone] pg/mL, it has been reported that between 5 and 10% of patients with values between 200 and 400 pg/mL may experience neuropsychiatric and hematologic abnormalities due to occult B12 deficiency; less than 1% of patients with values above 400 pg/mL will have symptoms. .   . TEST NAME: 01/09/2018 IRON, TIBC AND FERRITIN PANEL   Final  . TEST CODE: 01/09/2018 [id number]   Final  . CLIENT CONTACT: 01/09/2018 Learta Codding   Final  . REPORT ALWAYS MESSAGE SIGNATURE 01/09/2018    Final   Comment: . The laboratory testing on this patient was verbally requested or confirmed by the ordering physician or his or her authorized representative after contact with an employee of [employer]. Federal regulations require that we maintain on file written authorization for all laboratory testing.  Accordingly we are asking that the ordering physician or his or her authorized representative sign a copy of this report and promptly return it to the client service representative. . . Signature:____________________________________________________ . Please fax this signed page to [phone] or return it via your [employer] courier.    Echo revealed: Study Conclusions  - Left ventricle: The cavity size was normal. Wall thickness was    increased in a pattern of mild LVH. Systolic function was   vigorous. The estimated ejection fraction was in the range of 65%   to 70%. Wall motion was normal; there were no regional wall   motion abnormalities. Doppler parameters are consistent with   abnormal left ventricular relaxation (grade 1 diastolic   dysfunction). Indeterminate filling pressures. - Aortic valve: Trileaflet; mildly thickened, mildly calcified   leaflets. There was mild stenosis. Peak velocity (S): 218 cm/s.   Mean gradient (S): 10 mm Hg.  02/06/18 2 out of the 3 stool samples returned positive for blood.  Patient is still taking aspirin although he is also on his iron tablet.  Patient's hemoglobin in June of last year was 12.4.  In the wintertime, the patient suffered a back injury and began taking Aleve.  This seems like it is likely the time when the patient's hemoglobin began to drop.  This raises suspicions about an ulcer versus gastritis.  He is not taking a proton pump inhibitor however he has stopped his Aleve.  He is hesitant to see a stomach specialist.  He also reports dysuria and frequency and urgency for 24 hours.  Urinalysis shows +2 leukocyte esterase.  Past Medical History:  Diagnosis Date  . Arthritis   . COPD (chronic obstructive pulmonary disease) ([place])   . Emphysema of lung ([place])   . Hyperparathyroidism, primary ([place])   . Hypertension   . Prediabetes    Past Surgical History:  Procedure Laterality Date  . APPENDECTOMY  1956  . [place]  . ROTATOR CUFF REPAIR  11/2011   Current Outpatient Medications on File Prior to Visit  Medication Sig Dispense Refill  . albuterol (PROVENTIL HFA;VENTOLIN HFA) 108 (90 Base) MCG/ACT inhaler Inhale 2 puffs into the lungs every 6 (six) hours as needed for wheezing or shortness of breath. 1 Inhaler 3  . amLODipine (NORVASC) 10 MG tablet TAKE 1 TABLET (10 MG TOTAL) BY MOUTH DAILY. 90 tablet 3  . aspirin 81 MG tablet Take 81 mg by mouth daily.    Marland Kitchen  EPINEPHrine (EPIPEN 2-PAK) 0.3 mg/0.3 mL DEVI Inject 0.3 mLs (0.3 mg total) into the muscle once. 1 Device 2  . hydrochlorothiazide (HYDRODIURIL) 25 MG tablet TAKE 1 TABLET (25 MG TOTAL) BY MOUTH DAILY. 90 tablet 3  . losartan (COZAAR) 100 MG tablet Take 1 tablet (100 mg total) by mouth daily. 90 tablet 3  . metoprolol tartrate (LOPRESSOR) 50 MG tablet Take 1 tablet (50 mg total) by mouth 2 (two) times daily. (Patient taking differently: Take 25 mg by mouth 2 (two) times daily. ) 60 tablet 11  . umeclidinium-vilanterol (ANORO ELLIPTA) 62.5-25 MCG/INH AEPB INHALE ONE PUFF DAILY 30 each 0   No current facility-administered medications on file prior to visit.    No Known Allergies Social History   Socioeconomic History  . Marital status: Married    Spouse name: Not on file  . Number of children: Not on file  . Years of education: Not on file  . Highest education level: Not on file  Occupational History  . Not on file  Social Needs  . Financial resource strain: Not on file  . Food insecurity:    Worry: Not on file    Inability: Not on file  . Transportation needs:    Medical: Not on file    Non-medical: Not on file  Tobacco Use  . Smoking status: Former Smoker    Types: Cigarettes  . Smokeless tobacco: Former Systems developer    Quit date: 11/14/1993  . Tobacco comment: 3 packs a day  Substance and Sexual Activity  . Alcohol use: Yes    Comment: 3-4 beers a day prn  . Drug use: No  . Sexual activity: Not on file  Lifestyle  . Physical activity:    Days per week: Not on file    Minutes per session: Not on file  . Stress: Not on file  Relationships  . Social connections:    Talks on phone: Not on file    Gets together: Not on file    Attends religious service: Not on file    Active member of club or organization: Not on file    Attends meetings of clubs or organizations: Not on file    Relationship status: Not on file  . Intimate partner violence:    Fear of current or ex partner: Not  on file    Emotionally abused: Not on file    Physically abused: Not on file    Forced sexual activity: Not on file  Other Topics Concern  . Not on file  Social History Narrative  . Not on file      Review of Systems  All other systems reviewed and are negative.      Objective:   Physical Exam  Constitutional: He appears well-developed and well-nourished.  Neck: Neck supple. No JVD present.  Cardiovascular: Normal rate, regular rhythm and normal heart sounds.  Pulmonary/Chest: He has wheezes. He has rales.  Abdominal: Soft. Bowel sounds are normal. He exhibits no distension. There is no tenderness. There is no rebound.  Musculoskeletal: He exhibits no edema.  Lymphadenopathy:    He has no cervical adenopathy.  Vitals reviewed.         Assessment & Plan:  Dysuria - Plan: Urinalysis, Routine w reflex microscopic  Iron deficiency anemia, unspecified iron deficiency anemia type - Plan: CBC with Differential/Platelet, Ambulatory referral to Gastroenterology  Gastrointestinal hemorrhage, unspecified gastrointestinal hemorrhage type - Plan: Ambulatory referral to Gastroenterology  I believe his dyspnea on exertion is likely  related to his COPD compounded by his sudden anemia.  Based on his history, I suspect his anemia is due to NSAID use and I suspect he has gastritis.  I have recommended discontinuation of his aspirin.  I recommended starting pantoprazole 40 mg a day.  Continue iron sulfate 325 mg daily.  After much discussion, I finally convinced the patient is see a gastroenterologist.  Treat suspected urinary tract infection with Cipro 500 mg p.o. twice daily.  I suspect prostatitis and therefore I will treat the patient for a total of 10 days

## 2018-02-06 NOTE — Addendum Note (Signed)
Addended by: Shary Decamp B on: 02/06/2018 04:41 PM   Modules accepted: Orders

## 2018-02-07 ENCOUNTER — Other Ambulatory Visit: Payer: Self-pay | Admitting: Family Medicine

## 2018-02-07 ENCOUNTER — Encounter: Payer: Self-pay | Admitting: Gastroenterology

## 2018-02-07 LAB — CBC WITH DIFFERENTIAL/PLATELET
Basophils Absolute: 78 cells/uL (ref 0–200)
Basophils Relative: 0.9 %
EOS PCT: 0.8 %
Eosinophils Absolute: 70 cells/uL (ref 15–500)
HCT: 32.9 % — ABNORMAL LOW (ref 38.5–50.0)
HEMOGLOBIN: 10.2 g/dL — AB (ref 13.2–17.1)
LYMPHS ABS: 1705 {cells}/uL (ref 850–3900)
MCH: 21.1 pg — ABNORMAL LOW (ref 27.0–33.0)
MCHC: 31 g/dL — ABNORMAL LOW (ref 32.0–36.0)
MCV: 68 fL — ABNORMAL LOW (ref 80.0–100.0)
MPV: 10.6 fL (ref 7.5–12.5)
Monocytes Relative: 9.6 %
Neutro Abs: 6012 cells/uL (ref 1500–7800)
Neutrophils Relative %: 69.1 %
Platelets: 480 10*3/uL — ABNORMAL HIGH (ref 140–400)
RBC: 4.84 10*6/uL (ref 4.20–5.80)
RDW: 19.1 % — ABNORMAL HIGH (ref 11.0–15.0)
Total Lymphocyte: 19.6 %
WBC mixed population: 835 cells/uL (ref 200–950)
WBC: 8.7 10*3/uL (ref 3.8–10.8)

## 2018-02-07 LAB — TIQ-NTM

## 2018-02-09 LAB — URINE CULTURE
MICRO NUMBER:: 90378768
SPECIMEN QUALITY: ADEQUATE

## 2018-02-12 ENCOUNTER — Other Ambulatory Visit: Payer: Self-pay | Admitting: Family Medicine

## 2018-02-12 MED ORDER — NITROFURANTOIN MONOHYD MACRO 100 MG PO CAPS: 100.0000 mg | ORAL_CAPSULE | Freq: Two times a day (BID) | ORAL | 0 refills | Status: DC

## 2018-02-13 ENCOUNTER — Telehealth: Payer: Self-pay | Admitting: *Deleted

## 2018-02-13 MED ORDER — NITROFURANTOIN MONOHYD MACRO 100 MG PO CAPS: 100.0000 mg | ORAL_CAPSULE | Freq: Two times a day (BID) | ORAL | 0 refills | Status: DC

## 2018-02-13 NOTE — Telephone Encounter (Signed)
Received request from pharmacy for PA on Macrobid d/t patient is >77 yo.   PA submitted.   Dx: UTI with Enterococcus sensitive only to Macrobid.   Received immediate response.   PA Case [id number] Approved  Pharmacy made aware.

## 2018-02-27 ENCOUNTER — Encounter: Payer: Self-pay | Admitting: Family Medicine

## 2018-02-27 ENCOUNTER — Ambulatory Visit (INDEPENDENT_AMBULATORY_CARE_PROVIDER_SITE_OTHER): Payer: Medicare Other | Admitting: Family Medicine

## 2018-02-27 VITALS — BP 90/58 | HR 86 | Temp 97.9°F | Resp 22 | Ht 66.0 in | Wt 161.0 lb

## 2018-02-27 DIAGNOSIS — B952 Enterococcus as the cause of diseases classified elsewhere: Secondary | ICD-10-CM | POA: Diagnosis not present

## 2018-02-27 DIAGNOSIS — I9589 Other hypotension: Secondary | ICD-10-CM

## 2018-02-27 DIAGNOSIS — R300 Dysuria: Secondary | ICD-10-CM | POA: Diagnosis not present

## 2018-02-27 DIAGNOSIS — K922 Gastrointestinal hemorrhage, unspecified: Secondary | ICD-10-CM

## 2018-02-27 DIAGNOSIS — D509 Iron deficiency anemia, unspecified: Secondary | ICD-10-CM | POA: Diagnosis not present

## 2018-02-27 LAB — CBC WITH DIFFERENTIAL/PLATELET
BASOS PCT: 0.4 %
Basophils Absolute: 54 cells/uL (ref 0–200)
Eosinophils Absolute: 13 cells/uL — ABNORMAL LOW (ref 15–500)
Eosinophils Relative: 0.1 %
HEMATOCRIT: 36.4 % — AB (ref 38.5–50.0)
HEMOGLOBIN: 11.3 g/dL — AB (ref 13.2–17.1)
LYMPHS ABS: 1528 {cells}/uL (ref 850–3900)
MCH: 21.5 pg — ABNORMAL LOW (ref 27.0–33.0)
MCHC: 31 g/dL — ABNORMAL LOW (ref 32.0–36.0)
MCV: 69.2 fL — AB (ref 80.0–100.0)
MPV: 10.8 fL (ref 7.5–12.5)
Monocytes Relative: 5.4 %
NEUTROS ABS: 11082 {cells}/uL — AB (ref 1500–7800)
Neutrophils Relative %: 82.7 %
Platelets: 500 10*3/uL — ABNORMAL HIGH (ref 140–400)
RBC: 5.26 10*6/uL (ref 4.20–5.80)
RDW: 18.7 % — ABNORMAL HIGH (ref 11.0–15.0)
Total Lymphocyte: 11.4 %
WBC: 13.4 10*3/uL — ABNORMAL HIGH (ref 3.8–10.8)
WBCMIX: 724 {cells}/uL (ref 200–950)

## 2018-02-27 LAB — COMPREHENSIVE METABOLIC PANEL
AG Ratio: 1.1 (calc) (ref 1.0–2.5)
ALKALINE PHOSPHATASE (APISO): 123 U/L — AB (ref 40–115)
ALT: 9 U/L (ref 9–46)
AST: 12 U/L (ref 10–35)
Albumin: 4.2 g/dL (ref 3.6–5.1)
BILIRUBIN TOTAL: 1.1 mg/dL (ref 0.2–1.2)
BUN/Creatinine Ratio: 9 (calc) (ref 6–22)
BUN: 15 mg/dL (ref 7–25)
CALCIUM: 11.8 mg/dL — AB (ref 8.6–10.3)
CHLORIDE: 96 mmol/L — AB (ref 98–110)
CO2: 30 mmol/L (ref 20–32)
CREATININE: 1.66 mg/dL — AB (ref 0.70–1.18)
GLOBULIN: 3.7 g/dL (ref 1.9–3.7)
Glucose, Bld: 134 mg/dL — ABNORMAL HIGH (ref 65–99)
Potassium: 3.1 mmol/L — ABNORMAL LOW (ref 3.5–5.3)
Sodium: 137 mmol/L (ref 135–146)
Total Protein: 7.9 g/dL (ref 6.1–8.1)

## 2018-02-27 LAB — HEMOGLOBIN, FINGERSTICK: POC HEMOGLOBIN: 12.3 g/dL — ABNORMAL LOW (ref 13.0–17.0)

## 2018-02-27 MED ORDER — NITROFURANTOIN MONOHYD MACRO 100 MG PO CAPS: 100.0000 mg | ORAL_CAPSULE | Freq: Two times a day (BID) | ORAL | 0 refills | Status: DC

## 2018-02-27 MED ORDER — AMOXICILLIN-POT CLAVULANATE 875-125 MG PO TABS: 1.0000 | ORAL_TABLET | Freq: Two times a day (BID) | ORAL | 0 refills | Status: AC

## 2018-02-27 NOTE — Addendum Note (Signed)
Addended by: Shary Decamp B on: 02/27/2018 04:03 PM   Modules accepted: Orders

## 2018-02-27 NOTE — Progress Notes (Signed)
Subjective:    Patient ID: Nicholas Williams, male    DOB: November 13, 1941, 77 y.o.   MRN: [id number]  HPI  01/09/18 Patient is here today requesting oxygen.  He has a history of COPD.  His wife previously was on oxygen prior to her death and she saw benefit from oxygen regarding dyspnea on exertion.  He is experiencing significant dyspnea on exertion and is interested in trying oxygen as well.  I perform pulmonary function test today in the office which indicated severe obstruction.  FEV1 was 0.91 L.  FVC was 1.93 L.  FEV1 to FVC ratio was 47% indicating obstructive lung disease.  FEV1 was 38% of predicted indicating stage III borderline stage IV COPD.  Patient is compliant with anoro which he uses daily.  However he sees very little benefit from.  In fact he is using Combivent which was his wife's prescription 6 and 7 times a day for shortness of breath.  He states that every time he does any work, he becomes extremely winded.  He denies any chest pain.  He is 94% on room air sitting.  With ambulation, his pulse oximetry actually improved to 96% which does not qualify for oxygen.  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  At that time, my plan was: Patient does not qualify for oxygen and I am concerned about his severe dyspnea on exertion indicating other lung disease other than the COPD which he apparently has on pulmonary function testing.  I have recommended a high-resolution CT scan of the chest to evaluate for interstitial lung disease.  I have also recommended an echocardiogram to evaluate for congestive heart failure/systolic dysfunction.  02/06/18 Office Visit on 02/06/2018  Component Date Value Ref Range Status  . Color, Urine 02/06/2018 YELLOW  YELLOW Final  . APPearance 02/06/2018 CLEAR  CLEAR Final  . Specific Gravity, Urine 02/06/2018  1.015  1.001 - 1.03 Final   Comment: Verified by repeat analysis. .   . pH 02/06/2018 6.0  5.0 - 8.0 Final  . Glucose, UA 02/06/2018 NEGATIVE  NEGATIVE Final  . Bilirubin Urine 02/06/2018 NEGATIVE  NEGATIVE Final   Comment: Verified by repeat analysis. Tenna Child, ur 02/06/2018 NEGATIVE  NEGATIVE Final  . Hgb urine dipstick 02/06/2018 TRACE* NEGATIVE Final  . Protein, ur 02/06/2018 3+* NEGATIVE Final  . Nitrite 02/06/2018 NEGATIVE  NEGATIVE Final  . Leukocytes, UA 02/06/2018 2+* NEGATIVE Final  . WBC, UA 02/06/2018 40-60* 0 - 5 /HPF Final  . RBC / HPF 02/06/2018 0-2  0 - 2 /HPF Final  . Squamous Epithelial / LPF 02/06/2018 NONE SEEN  < OR = 5 /HPF Final  . Bacteria, UA 02/06/2018 FEW* NONE SEEN /HPF Final  . Hyaline Cast 02/06/2018 1-3* NONE SEEN /LPF Final  . WBC 02/06/2018 8.7  3.8 - 10.8 Thousand/uL Final  . RBC 02/06/2018 4.84  4.20 - 5.80 Million/uL Final  . Hemoglobin 02/06/2018 10.2* 13.2 - 17.1 g/dL Final  . HCT 02/06/2018 32.9* 38.5 - 50.0 % Final  . MCV 02/06/2018 68.0* 80.0 - 100.0 fL Final  . MCH 02/06/2018 21.1* 27.0 - 33.0 pg Final  . MCHC 02/06/2018 31.0* 32.0 - 36.0 g/dL Final  . RDW 02/06/2018 19.1* 11.0 - 15.0 % Final  . Platelets 02/06/2018 480* 140 - 400 Thousand/uL Final  . MPV 02/06/2018 10.6  7.5 -  12.5 fL Final  . Neutro Abs 02/06/2018 6,012  1,500 - 7,800 cells/uL Final  . Lymphs Abs 02/06/2018 1,705  850 - 3,900 cells/uL Final  . WBC mixed population 02/06/2018 835  200 - 950 cells/uL Final  . Eosinophils Absolute 02/06/2018 70  15 - 500 cells/uL Final  . Basophils Absolute 02/06/2018 78  0 - 200 cells/uL Final  . Neutrophils Relative % 02/06/2018 69.1  % Final  . Total Lymphocyte 02/06/2018 19.6  % Final  . Monocytes Relative 02/06/2018 9.6  % Final  . Eosinophils Relative 02/06/2018 0.8  % Final  . Basophils Relative 02/06/2018 0.9  % Final  . Smear Review 02/06/2018    Final   Comment: Review of peripheral smear confirms automated results.     . Note 02/06/2018    Final   Comment: This urine was analyzed for the presence of WBC,  RBC, bacteria, casts, and other formed elements.  Only those elements seen were reported. . .   . MICRO NUMBER: 02/06/2018 [id number]   Final  . SPECIMEN QUALITY: 02/06/2018 ADEQUATE   Final  . Sample Source 02/06/2018 URINE   Final  . STATUS: 02/06/2018 FINAL   Final  . ISOLATE 1: 02/06/2018 Enterococcus faecalis*  Final   Greater than 100,000 CFU/mL of Enterococcus faecalis  . QUESTION/PROBLEM: 02/06/2018    Final   Comment: . No test(s) are indicated on the requisition for the following specimen(s). .   . SPECIMEN(S) RECEIVED: 02/06/2018 SS   Final   Comment: To prevent further delays in testing, please contact us immediately at [phone] order to  resolve this questionable order. Fax completed form to [phone].   Appointment on 01/16/2018  Component Date Value Ref Range Status  . MICRO NUMBER: 01/13/2018 [id number]   Final  . SPECIMEN QUALITY: 01/13/2018 ADEQUATE   Final  . Source: 01/13/2018 INSURE (TM) FOBT TEST CARD   Final  . STATUS: 01/13/2018 FINAL   Final  . FECAL GLOBIN RESULT: 01/13/2018 Detected*  Final  . MICRO NUMBER: 01/14/2018 [id number]   Final  . SPECIMEN QUALITY: 01/14/2018 ADEQUATE   Final  . Source: 01/14/2018 INSURE (TM) FOBT TEST CARD   Final  . STATUS: 01/14/2018 FINAL   Final  . FECAL GLOBIN RESULT: 01/14/2018 Not Detected   Final  . MICRO NUMBER: 01/15/2018 [id number]   Final  . SPECIMEN QUALITY: 01/15/2018 ADEQUATE   Final  . Source: 01/15/2018 INSURE (TM) FOBT TEST CARD   Final  . STATUS: 01/15/2018 FINAL   Final  . FECAL GLOBIN RESULT: 01/15/2018 Detected*  Final  Office Visit on 01/09/2018  Component Date Value Ref Range Status  . WBC 01/09/2018 8.8  3.8 - 10.8 Thousand/uL Final  . RBC 01/09/2018 4.35  4.20 - 5.80 Million/uL Final  . Hemoglobin 01/09/2018 8.5* 13.2 - 17.1 g/dL Final  . HCT 01/09/2018 28.5* 38.5 - 50.0 % Final  . MCV  01/09/2018 65.5* 80.0 - 100.0 fL Final  . MCH 01/09/2018 19.5* 27.0 - 33.0 pg Final  . MCHC 01/09/2018 29.8* 32.0 - 36.0 g/dL Final  . RDW 01/09/2018 16.5* 11.0 - 15.0 % Final  . Platelets 01/09/2018 419* 140 - 400 Thousand/uL Final  . MPV 01/09/2018 10.7  7.5 - 12.5 fL Final  . Neutro Abs 01/09/2018 5,843  1,500 - 7,800 cells/uL Final  . Lymphs Abs 01/09/2018 1,971  850 - 3,900 cells/uL Final  . WBC mixed population 01/09/2018 792  200 - 950 cells/uL Final  . Eosinophils Absolute 01/09/2018 123  15 - 500 cells/uL Final  . Basophils Absolute 01/09/2018 70  0 - 200 cells/uL Final  . Neutrophils Relative % 01/09/2018 66.4  % Final  . Total Lymphocyte 01/09/2018 22.4  % Final  . Monocytes Relative 01/09/2018 9.0  % Final  . Eosinophils Relative 01/09/2018 1.4  % Final  . Basophils Relative 01/09/2018 0.8  % Final  . Glucose, Bld 01/09/2018 106* 65 - 99 mg/dL Final   Comment: .            Fasting reference interval . For someone without known diabetes, a glucose value between 100 and 125 mg/dL is consistent with prediabetes and should be confirmed with a follow-up test. .   . BUN 01/09/2018 14  7 - 25 mg/dL Final  . Creat 01/09/2018 1.19* 0.70 - 1.18 mg/dL Final   Comment: For patients >61 years of age, the reference limit for Creatinine is approximately 13% higher for people identified as African-American. .   . GFR, Est Non African American 01/09/2018 58* > OR = 60 mL/min/1.42m2 Final  . GFR, Est African American 01/09/2018 67  > OR = 60 mL/min/1.71m2 Final  . BUN/Creatinine Ratio 01/09/2018 12  6 - 22 (calc) Final  . Sodium 01/09/2018 138  135 - 146 mmol/L Final  . Potassium 01/09/2018 4.4  3.5 - 5.3 mmol/L Final  . Chloride 01/09/2018 102  98 - 110 mmol/L Final  . CO2 01/09/2018 26  20 - 32 mmol/L Final  . Calcium 01/09/2018 11.6* 8.6 - 10.3 mg/dL Final  . Total Protein 01/09/2018 7.5  6.1 - 8.1 g/dL Final  . Albumin 01/09/2018 4.0  3.6 - 5.1 g/dL Final  . Globulin  01/09/2018 3.5  1.9 - 3.7 g/dL (calc) Final  . AG Ratio 01/09/2018 1.1  1.0 - 2.5 (calc) Final  . Total Bilirubin 01/09/2018 0.6  0.2 - 1.2 mg/dL Final  . Alkaline phosphatase (APISO) 01/09/2018 118* 40 - 115 U/L Final  . AST 01/09/2018 13  10 - 35 U/L Final  . ALT 01/09/2018 10  9 - 46 U/L Final  . Cholesterol 01/09/2018 147  <200 mg/dL Final  . HDL 01/09/2018 36* >40 mg/dL Final  . Triglycerides 01/09/2018 127  <150 mg/dL Final  . LDL Cholesterol (Calc) 01/09/2018 89  mg/dL (calc) Final   Comment: Reference range: <100 . Desirable range <100 mg/dL for primary prevention;   <70 mg/dL for patients with CHD or diabetic patients  with > or = 2 CHD risk factors. Marland Kitchen LDL-C is now calculated using the Martin-Hopkins  calculation, which is a validated novel method providing  better accuracy than the Friedewald equation in the  estimation of LDL-C.  Cresenciano Genre et al. Annamaria Helling. 1610;960(45): 2061-2068  (http://education.[facility].com/faq/FAQ164)   . Total CHOL/HDL Ratio 01/09/2018 4.1  <5.0 (calc) Final  . Non-HDL Cholesterol (Calc) 01/09/2018 111  <130 mg/dL (calc) Final   Comment: For patients with diabetes plus 1 major ASCVD risk  factor, treating to a non-HDL-C goal of <100 mg/dL  (LDL-C of <70 mg/dL) is considered a therapeutic  option.   . CBC MORPHOLOGY 01/09/2018   NORMAL Final   Comment: Elliptocytes 1 + Anisocytosis 1 + Polychromasia 1 +   . Iron 01/09/2018 22* 50 - 180 mcg/dL Final  . TIBC 01/09/2018 472* 250 - 425 mcg/dL (calc) Final  . %SAT 01/09/2018 5* 15 - 60 % (calc) Final  . Ferritin 01/09/2018 5* 20 - 380 ng/mL Final  . Vitamin B-12 01/09/2018 297  200 - 1,100 pg/mL  Final   Comment: . Please Note: Although the reference range for vitamin B12 is [phone] pg/mL, it has been reported that between 5 and 10% of patients with values between 200 and 400 pg/mL may experience neuropsychiatric and hematologic abnormalities due to occult B12 deficiency; less than 1% of  patients with values above 400 pg/mL will have symptoms. .   . TEST NAME: 01/09/2018 IRON, TIBC AND FERRITIN PANEL   Final  . TEST CODE: 01/09/2018 [id number]   Final  . CLIENT CONTACT: 01/09/2018 Learta Codding   Final  . REPORT ALWAYS MESSAGE SIGNATURE 01/09/2018    Final   Comment: . The laboratory testing on this patient was verbally requested or confirmed by the ordering physician or his or her authorized representative after contact with an employee of [employer]. Federal regulations require that we maintain on file written authorization for all laboratory testing.  Accordingly we are asking that the ordering physician or his or her authorized representative sign a copy of this report and promptly return it to the client service representative. . . Signature:____________________________________________________ . Please fax this signed page to [phone] or return it via your [employer] courier.    Echo revealed: Study Conclusions  - Left ventricle: The cavity size was normal. Wall thickness was   increased in a pattern of mild LVH. Systolic function was   vigorous. The estimated ejection fraction was in the range of 65%   to 70%. Wall motion was normal; there were no regional wall   motion abnormalities. Doppler parameters are consistent with   abnormal left ventricular relaxation (grade 1 diastolic   dysfunction). Indeterminate filling pressures. - Aortic valve: Trileaflet; mildly thickened, mildly calcified   leaflets. There was mild stenosis. Peak velocity (S): 218 cm/s.   Mean gradient (S): 10 mm Hg.  02/06/18 2 out of the 3 stool samples returned positive for blood.  Patient is still taking aspirin although he is also on his iron tablet.  Patient's hemoglobin in June of last year was 12.4.  In the wintertime, the patient suffered a back injury and began taking Aleve.  This seems like it is likely the time when the patient's hemoglobin began to drop.   This raises suspicions about an ulcer versus gastritis.  He is not taking a proton pump inhibitor however he has stopped his Aleve.  He is hesitant to see a stomach specialist.  He also reports dysuria and frequency and urgency for 24 hours.  Urinalysis shows +2 leukocyte esterase.  At that time, my plan was: I believe his dyspnea on exertion is likely related to his COPD compounded by his sudden anemia.  Based on his history, I suspect his anemia is due to NSAID use and I suspect he has gastritis.  I have recommended discontinuation of his aspirin.  I recommended starting pantoprazole 40 mg a day.  Continue iron sulfate 325 mg daily.  After much discussion, I finally convinced the patient is see a gastroenterologist.  Treat suspected urinary tract infection with Cipro 500 mg p.o. twice daily.  I suspect prostatitis and therefore I will treat the patient for a total of 10 days  02/27/18 Patient saw no benefit on Cipro.  Urine culture eventually returned enterococcus infection which was sensitive to ampicillin, vancomycin, and nitrofurantoin.  Patient was switched to 7 days of nitrofurantoin 100 mg p.o. twice daily.  Patient states that after 5 days, the symptoms gradually subsided and had essentially gone away.  However within 2 days of  stopping the antibiotics, the symptoms have returned.  He reports lower abdominal discomfort, pelvic pressure, decreased urine output, frequency, burning.  Blood pressure is extremely low today at 90/58.  Patient is afebrile.  However he is short of breath.  He also reports feeling weak and tired.  I believe the patient is showing evidence of infection and possibly hypotension secondary to dehydration although I cannot exclude sepsis.  Patient refuses hospitalization.  Fingerstick hemoglobin obtained today in the office was 12.1 suggesting against an acute GI bleed.  Past Medical History:  Diagnosis Date  . Arthritis   . COPD (chronic obstructive pulmonary disease) ([place])   .  Emphysema of lung ([place])   . Hyperparathyroidism, primary ([place])   . Hypertension   . Prediabetes    Past Surgical History:  Procedure Laterality Date  . APPENDECTOMY  1956  . [place]  . ROTATOR CUFF REPAIR  11/2011   Current Outpatient Medications on File Prior to Visit  Medication Sig Dispense Refill  . albuterol (PROVENTIL HFA;VENTOLIN HFA) 108 (90 Base) MCG/ACT inhaler Inhale 2 puffs into the lungs every 6 (six) hours as needed for wheezing or shortness of breath. 1 Inhaler 3  . amLODipine (NORVASC) 10 MG tablet TAKE 1 TABLET (10 MG TOTAL) BY MOUTH DAILY. 90 tablet 3  . aspirin 81 MG tablet Take 81 mg by mouth daily.    Marland Kitchen EPINEPHrine (EPIPEN 2-PAK) 0.3 mg/0.3 mL DEVI Inject 0.3 mLs (0.3 mg total) into the muscle once. 1 Device 2  . hydrochlorothiazide (HYDRODIURIL) 25 MG tablet TAKE 1 TABLET (25 MG TOTAL) BY MOUTH DAILY. 90 tablet 3  . losartan (COZAAR) 100 MG tablet Take 1 tablet (100 mg total) by mouth daily. 90 tablet 3  . metoprolol tartrate (LOPRESSOR) 50 MG tablet Take 1 tablet (50 mg total) by mouth 2 (two) times daily. (Patient taking differently: Take 25 mg by mouth 2 (two) times daily. ) 60 tablet 11  . pantoprazole (PROTONIX) 40 MG tablet Take 1 tablet (40 mg total) by mouth daily. 30 tablet 3  . umeclidinium-vilanterol (ANORO ELLIPTA) 62.5-25 MCG/INH AEPB INHALE ONE PUFF DAILY 30 each 0   No current facility-administered medications on file prior to visit.    No Known Allergies Social History   Socioeconomic History  . Marital status: Married    Spouse name: Not on file  . Number of children: Not on file  . Years of education: Not on file  . Highest education level: Not on file  Occupational History  . Not on file  Social Needs  . Financial resource strain: Not on file  . Food insecurity:    Worry: Not on file    Inability: Not on file  . Transportation needs:    Medical: Not on file    Non-medical: Not on file  Tobacco Use  . Smoking status:  Former Smoker    Types: Cigarettes  . Smokeless tobacco: Former Systems developer    Quit date: 11/14/1993  . Tobacco comment: 3 packs a day  Substance and Sexual Activity  . Alcohol use: Yes    Comment: 3-4 beers a day prn  . Drug use: No  . Sexual activity: Not on file  Lifestyle  . Physical activity:    Days per week: Not on file    Minutes per session: Not on file  . Stress: Not on file  Relationships  . Social connections:    Talks on phone: Not on file    Gets together: Not  on file    Attends religious service: Not on file    Active member of club or organization: Not on file    Attends meetings of clubs or organizations: Not on file    Relationship status: Not on file  . Intimate partner violence:    Fear of current or ex partner: Not on file    Emotionally abused: Not on file    Physically abused: Not on file    Forced sexual activity: Not on file  Other Topics Concern  . Not on file  Social History Narrative  . Not on file      Review of Systems  All other systems reviewed and are negative.      Objective:   Physical Exam  Constitutional: He appears well-developed and well-nourished. He has a sickly appearance.  HENT:  Mouth/Throat: Mucous membranes are dry.  Neck: Neck supple. No JVD present.  Cardiovascular: Normal rate, regular rhythm and normal heart sounds.  Pulmonary/Chest: He has wheezes. He has rales.  Abdominal: Soft. Bowel sounds are normal. He exhibits no distension. There is no tenderness. There is no rebound.  Musculoskeletal: He exhibits no edema.  Lymphadenopathy:    He has no cervical adenopathy.  Vitals reviewed.         Assessment & Plan:  Burning with urination - Plan: Urinalysis, Routine w reflex microscopic  Other specified hypotension - Plan: Hemoglobin, fingerstick  Iron deficiency anemia, unspecified iron deficiency anemia type  Gastrointestinal hemorrhage, unspecified gastrointestinal hemorrhage type  Enterococcus faecalis  infection  Patient is unable to provide a urine sample at the present time.  He reports decreased urine output over the last few days.  He has been off antibiotics now for almost a week.  Symptoms are worsening.  I believe he has an untreated Enterococcus faecalis infection/UTI.  I have recommended hospitalization and IV antibiotics given his presentation and relative hypotension.  Patient refuses hospitalization.  Fingerstick hemoglobin today rules out an acute GI bleed as a source of his hypotension.  I have recommended discontinuation of amlodipine, hydrochlorothiazide, and losartan.  Patient was given 1 L of normal saline today in the office due to dehydration and hypotension.  I will start the patient on Augmentin 875 mg p.o. twice daily and I will treat the patient for 2 weeks due to likely complicated urinary tract infection versus prostatitis.  Given uncertain augmentin sensitivity, I will also add nitrofurantoin 100 mg p.o. twice daily.  Although enterococcus shows sensitivity to this antibiotic, I am concerned that it cannot reach therapeutic levels to treat possible pyelonephritis.  Therefore, hopefully Augmentin, will provide adequate coverage given its sensitivity to ampicillin.  I have recommended hospitalization however the patient declined [place].  I want him to return tomorrow for recheck.  He is to go to hospital if worsening.

## 2018-02-28 ENCOUNTER — Other Ambulatory Visit: Payer: Self-pay | Admitting: Family Medicine

## 2018-02-28 MED ORDER — POTASSIUM CHLORIDE CRYS ER 20 MEQ PO TBCR: 20.0000 meq | EXTENDED_RELEASE_TABLET | Freq: Once | ORAL | 0 refills | Status: DC

## 2018-03-01 ENCOUNTER — Other Ambulatory Visit: Payer: Self-pay

## 2018-03-01 ENCOUNTER — Encounter: Payer: Self-pay | Admitting: Family Medicine

## 2018-03-01 ENCOUNTER — Other Ambulatory Visit: Payer: BLUE CROSS/BLUE SHIELD

## 2018-03-01 ENCOUNTER — Ambulatory Visit (INDEPENDENT_AMBULATORY_CARE_PROVIDER_SITE_OTHER): Payer: Medicare Other | Admitting: Family Medicine

## 2018-03-01 DIAGNOSIS — D509 Iron deficiency anemia, unspecified: Secondary | ICD-10-CM | POA: Diagnosis not present

## 2018-03-01 DIAGNOSIS — R300 Dysuria: Secondary | ICD-10-CM | POA: Diagnosis not present

## 2018-03-01 DIAGNOSIS — I10 Essential (primary) hypertension: Secondary | ICD-10-CM | POA: Diagnosis not present

## 2018-03-01 DIAGNOSIS — B952 Enterococcus as the cause of diseases classified elsewhere: Secondary | ICD-10-CM | POA: Diagnosis not present

## 2018-03-01 LAB — MICROSCOPIC MESSAGE

## 2018-03-01 LAB — URINALYSIS, ROUTINE W REFLEX MICROSCOPIC
Bacteria, UA: NONE SEEN /HPF
Bilirubin Urine: NEGATIVE
Glucose, UA: NEGATIVE
Ketones, ur: NEGATIVE
Nitrite: NEGATIVE
Specific Gravity, Urine: 1.015 (ref 1.001–1.03)
Squamous Epithelial / HPF: NONE SEEN /HPF
pH: 5.5 (ref 5.0–8.0)

## 2018-03-01 LAB — COMPLETE METABOLIC PANEL WITH GFR
AG Ratio: 1.3 (calc) (ref 1.0–2.5)
ALKALINE PHOSPHATASE (APISO): 106 U/L (ref 40–115)
ALT: 9 U/L (ref 9–46)
AST: 14 U/L (ref 10–35)
Albumin: 4 g/dL (ref 3.6–5.1)
BILIRUBIN TOTAL: 0.8 mg/dL (ref 0.2–1.2)
BUN: 20 mg/dL (ref 7–25)
CHLORIDE: 100 mmol/L (ref 98–110)
CO2: 27 mmol/L (ref 20–32)
CREATININE: 1.13 mg/dL (ref 0.70–1.18)
Calcium: 11.3 mg/dL — ABNORMAL HIGH (ref 8.6–10.3)
GFR, Est African American: 72 mL/min/{1.73_m2} (ref >=60)
GFR, Est Non African American: 62 mL/min/{1.73_m2} (ref >=60)
GLUCOSE: 111 mg/dL — AB (ref 65–99)
Globulin: 3.2 g/dL (calc) (ref 1.9–3.7)
Potassium: 3.3 mmol/L — ABNORMAL LOW (ref 3.5–5.3)
Sodium: 137 mmol/L (ref 135–146)
Total Protein: 7.2 g/dL (ref 6.1–8.1)

## 2018-03-01 LAB — CBC WITH DIFFERENTIAL/PLATELET
BASOS PCT: 0.8 %
Basophils Absolute: 53 cells/uL (ref 0–200)
EOS PCT: 1.5 %
Eosinophils Absolute: 99 cells/uL (ref 15–500)
HEMATOCRIT: 32.5 % — AB (ref 38.5–50.0)
Hemoglobin: 10 g/dL — ABNORMAL LOW (ref 13.2–17.1)
LYMPHS ABS: 878 {cells}/uL (ref 850–3900)
MCH: 21.5 pg — ABNORMAL LOW (ref 27.0–33.0)
MCHC: 30.8 g/dL — AB (ref 32.0–36.0)
MCV: 69.9 fL — ABNORMAL LOW (ref 80.0–100.0)
MPV: 10.2 fL (ref 7.5–12.5)
Monocytes Relative: 8.8 %
NEUTROS PCT: 75.6 %
Neutro Abs: 4990 cells/uL (ref 1500–7800)
Platelets: 343 10*3/uL (ref 140–400)
RBC: 4.65 10*6/uL (ref 4.20–5.80)
RDW: 18.2 % — AB (ref 11.0–15.0)
TOTAL LYMPHOCYTE: 13.3 %
WBC: 6.6 10*3/uL (ref 3.8–10.8)
WBCMIX: 581 {cells}/uL (ref 200–950)

## 2018-03-01 NOTE — Progress Notes (Signed)
Subjective:    Patient ID: Nicholas Williams, male    DOB: Sep 17, 1941, 77 y.o.   MRN: [id number]  HPI  01/09/18 Patient is here today requesting oxygen.  He has a history of COPD.  His wife previously was on oxygen prior to her death and she saw benefit from oxygen regarding dyspnea on exertion.  He is experiencing significant dyspnea on exertion and is interested in trying oxygen as well.  I perform pulmonary function test today in the office which indicated severe obstruction.  FEV1 was 0.91 L.  FVC was 1.93 L.  FEV1 to FVC ratio was 47% indicating obstructive lung disease.  FEV1 was 38% of predicted indicating stage III borderline stage IV COPD.  Patient is compliant with anoro which he uses daily.  However he sees very little benefit from.  In fact he is using Combivent which was his wife's prescription 6 and 7 times a day for shortness of breath.  He states that every time he does any work, he becomes extremely winded.  He denies any chest pain.  He is 94% on room air sitting.  With ambulation, his pulse oximetry actually improved to 96% which does not qualify for oxygen.  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  At that time, my plan was: Patient does not qualify for oxygen and I am concerned about his severe dyspnea on exertion indicating other lung disease other than the COPD which he apparently has on pulmonary function testing.  I have recommended a high-resolution CT scan of the chest to evaluate for interstitial lung disease.  I have also recommended an echocardiogram to evaluate for congestive heart failure/systolic dysfunction.  02/06/18 Office Visit on 02/27/2018  Component Date Value Ref Range Status  . POC HEMOGLOBIN 02/27/2018 12.3* 13.0 - 17.0 g/dL Final  . WBC 02/27/2018 13.4* 3.8 - 10.8 Thousand/uL Final  . RBC 02/27/2018  5.26  4.20 - 5.80 Million/uL Final  . Hemoglobin 02/27/2018 11.3* 13.2 - 17.1 g/dL Final  . HCT 02/27/2018 36.4* 38.5 - 50.0 % Final  . MCV 02/27/2018 69.2* 80.0 - 100.0 fL Final  . MCH 02/27/2018 21.5* 27.0 - 33.0 pg Final  . MCHC 02/27/2018 31.0* 32.0 - 36.0 g/dL Final  . RDW 02/27/2018 18.7* 11.0 - 15.0 % Final  . Platelets 02/27/2018 500* 140 - 400 Thousand/uL Final  . MPV 02/27/2018 10.8  7.5 - 12.5 fL Final  . Neutro Abs 02/27/2018 11,082* 1,500 - 7,800 cells/uL Final  . Lymphs Abs 02/27/2018 1,528  850 - 3,900 cells/uL Final  . WBC mixed population 02/27/2018 724  200 - 950 cells/uL Final  . Eosinophils Absolute 02/27/2018 13* 15 - 500 cells/uL Final  . Basophils Absolute 02/27/2018 54  0 - 200 cells/uL Final  . Neutrophils Relative % 02/27/2018 82.7  % Final  . Total Lymphocyte 02/27/2018 11.4  % Final  . Monocytes Relative 02/27/2018 5.4  % Final  . Eosinophils Relative 02/27/2018 0.1  % Final  . Basophils Relative 02/27/2018 0.4  % Final  . Smear Review 02/27/2018    Final   Comment: . Red cell morphology appears unremarkable . Review of peripheral smear confirms automated results.   . Glucose, Bld 02/27/2018 134* 65 - 99 mg/dL Final   Comment: .            Fasting reference interval . For  someone without known diabetes, a glucose value >125 mg/dL indicates that they may have diabetes and this should be confirmed with a follow-up test. .   . BUN 02/27/2018 15  7 - 25 mg/dL Final  . Creat 02/27/2018 1.66* 0.70 - 1.18 mg/dL Final   Comment: For patients >29 years of age, the reference limit for Creatinine is approximately 13% higher for people identified as African-American. .   Havery Moros Ratio 02/27/2018 9  6 - 22 (calc) Final  . Sodium 02/27/2018 137  135 - 146 mmol/L Final  . Potassium 02/27/2018 3.1* 3.5 - 5.3 mmol/L Final  . Chloride 02/27/2018 96* 98 - 110 mmol/L Final  . CO2 02/27/2018 30  20 - 32 mmol/L Final  . Calcium 02/27/2018 11.8* 8.6 -  10.3 mg/dL Final  . Total Protein 02/27/2018 7.9  6.1 - 8.1 g/dL Final  . Albumin 02/27/2018 4.2  3.6 - 5.1 g/dL Final  . Globulin 02/27/2018 3.7  1.9 - 3.7 g/dL (calc) Final  . AG Ratio 02/27/2018 1.1  1.0 - 2.5 (calc) Final  . Total Bilirubin 02/27/2018 1.1  0.2 - 1.2 mg/dL Final  . Alkaline phosphatase (APISO) 02/27/2018 123* 40 - 115 U/L Final  . AST 02/27/2018 12  10 - 35 U/L Final  . ALT 02/27/2018 9  9 - 46 U/L Final  Office Visit on 02/06/2018  Component Date Value Ref Range Status  . Color, Urine 02/06/2018 YELLOW  YELLOW Final  . APPearance 02/06/2018 CLEAR  CLEAR Final  . Specific Gravity, Urine 02/06/2018 1.015  1.001 - 1.03 Final   Comment: Verified by repeat analysis. .   . pH 02/06/2018 6.0  5.0 - 8.0 Final  . Glucose, UA 02/06/2018 NEGATIVE  NEGATIVE Final  . Bilirubin Urine 02/06/2018 NEGATIVE  NEGATIVE Final   Comment: Verified by repeat analysis. Tenna Child, ur 02/06/2018 NEGATIVE  NEGATIVE Final  . Hgb urine dipstick 02/06/2018 TRACE* NEGATIVE Final  . Protein, ur 02/06/2018 3+* NEGATIVE Final  . Nitrite 02/06/2018 NEGATIVE  NEGATIVE Final  . Leukocytes, UA 02/06/2018 2+* NEGATIVE Final  . WBC, UA 02/06/2018 40-60* 0 - 5 /HPF Final  . RBC / HPF 02/06/2018 0-2  0 - 2 /HPF Final  . Squamous Epithelial / LPF 02/06/2018 NONE SEEN  < OR = 5 /HPF Final  . Bacteria, UA 02/06/2018 FEW* NONE SEEN /HPF Final  . Hyaline Cast 02/06/2018 1-3* NONE SEEN /LPF Final  . WBC 02/06/2018 8.7  3.8 - 10.8 Thousand/uL Final  . RBC 02/06/2018 4.84  4.20 - 5.80 Million/uL Final  . Hemoglobin 02/06/2018 10.2* 13.2 - 17.1 g/dL Final  . HCT 02/06/2018 32.9* 38.5 - 50.0 % Final  . MCV 02/06/2018 68.0* 80.0 - 100.0 fL Final  . MCH 02/06/2018 21.1* 27.0 - 33.0 pg Final  . MCHC 02/06/2018 31.0* 32.0 - 36.0 g/dL Final  . RDW 02/06/2018 19.1* 11.0 - 15.0 % Final  . Platelets 02/06/2018 480* 140 - 400 Thousand/uL Final  . MPV 02/06/2018 10.6  7.5 - 12.5 fL Final  . Neutro Abs  02/06/2018 6,012  1,500 - 7,800 cells/uL Final  . Lymphs Abs 02/06/2018 1,705  850 - 3,900 cells/uL Final  . WBC mixed population 02/06/2018 835  200 - 950 cells/uL Final  . Eosinophils Absolute 02/06/2018 70  15 - 500 cells/uL Final  . Basophils Absolute 02/06/2018 78  0 - 200 cells/uL Final  . Neutrophils Relative % 02/06/2018 69.1  % Final  . Total Lymphocyte 02/06/2018 19.6  %  Final  . Monocytes Relative 02/06/2018 9.6  % Final  . Eosinophils Relative 02/06/2018 0.8  % Final  . Basophils Relative 02/06/2018 0.9  % Final  . Smear Review 02/06/2018    Final   Comment: Review of peripheral smear confirms automated results.   . Note 02/06/2018    Final   Comment: This urine was analyzed for the presence of WBC,  RBC, bacteria, casts, and other formed elements.  Only those elements seen were reported. . .   . MICRO NUMBER: 02/06/2018 [id number]   Final  . SPECIMEN QUALITY: 02/06/2018 ADEQUATE   Final  . Sample Source 02/06/2018 URINE   Final  . STATUS: 02/06/2018 FINAL   Final  . ISOLATE 1: 02/06/2018 Enterococcus faecalis*  Final   Greater than 100,000 CFU/mL of Enterococcus faecalis  . QUESTION/PROBLEM: 02/06/2018    Final   Comment: . No test(s) are indicated on the requisition for the following specimen(s). .   . SPECIMEN(S) RECEIVED: 02/06/2018 SS   Final   Comment: To prevent further delays in testing, please contact us immediately at [phone] order to  resolve this questionable order. Fax completed form to [phone].   Appointment on 01/16/2018  Component Date Value Ref Range Status  . MICRO NUMBER: 01/13/2018 [id number]   Final  . SPECIMEN QUALITY: 01/13/2018 ADEQUATE   Final  . Source: 01/13/2018 INSURE (TM) FOBT TEST CARD   Final  . STATUS: 01/13/2018 FINAL   Final  . FECAL GLOBIN RESULT: 01/13/2018 Detected*  Final  . MICRO NUMBER: 01/14/2018 [id number]   Final  . SPECIMEN QUALITY: 01/14/2018 ADEQUATE   Final  . Source: 01/14/2018 INSURE (TM)  FOBT TEST CARD   Final  . STATUS: 01/14/2018 FINAL   Final  . FECAL GLOBIN RESULT: 01/14/2018 Not Detected   Final  . MICRO NUMBER: 01/15/2018 [id number]   Final  . SPECIMEN QUALITY: 01/15/2018 ADEQUATE   Final  . Source: 01/15/2018 INSURE (TM) FOBT TEST CARD   Final  . STATUS: 01/15/2018 FINAL   Final  . FECAL GLOBIN RESULT: 01/15/2018 Detected*  Final  Office Visit on 01/09/2018  Component Date Value Ref Range Status  . WBC 01/09/2018 8.8  3.8 - 10.8 Thousand/uL Final  . RBC 01/09/2018 4.35  4.20 - 5.80 Million/uL Final  . Hemoglobin 01/09/2018 8.5* 13.2 - 17.1 g/dL Final  . HCT 01/09/2018 28.5* 38.5 - 50.0 % Final  . MCV 01/09/2018 65.5* 80.0 - 100.0 fL Final  . MCH 01/09/2018 19.5* 27.0 - 33.0 pg Final  . MCHC 01/09/2018 29.8* 32.0 - 36.0 g/dL Final  . RDW 01/09/2018 16.5* 11.0 - 15.0 % Final  . Platelets 01/09/2018 419* 140 - 400 Thousand/uL Final  . MPV 01/09/2018 10.7  7.5 - 12.5 fL Final  . Neutro Abs 01/09/2018 5,843  1,500 - 7,800 cells/uL Final  . Lymphs Abs 01/09/2018 1,971  850 - 3,900 cells/uL Final  . WBC mixed population 01/09/2018 792  200 - 950 cells/uL Final  . Eosinophils Absolute 01/09/2018 123  15 - 500 cells/uL Final  . Basophils Absolute 01/09/2018 70  0 - 200 cells/uL Final  . Neutrophils Relative % 01/09/2018 66.4  % Final  . Total Lymphocyte 01/09/2018 22.4  % Final  . Monocytes Relative 01/09/2018 9.0  % Final  . Eosinophils Relative 01/09/2018 1.4  % Final  . Basophils Relative 01/09/2018 0.8  % Final  . Glucose, Bld 01/09/2018 106* 65 - 99 mg/dL Final   Comment: .  Fasting reference interval . For someone without known diabetes, a glucose value between 100 and 125 mg/dL is consistent with prediabetes and should be confirmed with a follow-up test. .   . BUN 01/09/2018 14  7 - 25 mg/dL Final  . Creat 01/09/2018 1.19* 0.70 - 1.18 mg/dL Final   Comment: For patients >99 years of age, the reference limit for Creatinine is approximately 13%  higher for people identified as African-American. .   . GFR, Est Non African American 01/09/2018 58* > OR = 60 mL/min/1.17m2 Final  . GFR, Est African American 01/09/2018 67  > OR = 60 mL/min/1.68m2 Final  . BUN/Creatinine Ratio 01/09/2018 12  6 - 22 (calc) Final  . Sodium 01/09/2018 138  135 - 146 mmol/L Final  . Potassium 01/09/2018 4.4  3.5 - 5.3 mmol/L Final  . Chloride 01/09/2018 102  98 - 110 mmol/L Final  . CO2 01/09/2018 26  20 - 32 mmol/L Final  . Calcium 01/09/2018 11.6* 8.6 - 10.3 mg/dL Final  . Total Protein 01/09/2018 7.5  6.1 - 8.1 g/dL Final  . Albumin 01/09/2018 4.0  3.6 - 5.1 g/dL Final  . Globulin 01/09/2018 3.5  1.9 - 3.7 g/dL (calc) Final  . AG Ratio 01/09/2018 1.1  1.0 - 2.5 (calc) Final  . Total Bilirubin 01/09/2018 0.6  0.2 - 1.2 mg/dL Final  . Alkaline phosphatase (APISO) 01/09/2018 118* 40 - 115 U/L Final  . AST 01/09/2018 13  10 - 35 U/L Final  . ALT 01/09/2018 10  9 - 46 U/L Final  . Cholesterol 01/09/2018 147  <200 mg/dL Final  . HDL 01/09/2018 36* >40 mg/dL Final  . Triglycerides 01/09/2018 127  <150 mg/dL Final  . LDL Cholesterol (Calc) 01/09/2018 89  mg/dL (calc) Final   Comment: Reference range: <100 . Desirable range <100 mg/dL for primary prevention;   <70 mg/dL for patients with CHD or diabetic patients  with > or = 2 CHD risk factors. Marland Kitchen LDL-C is now calculated using the Martin-Hopkins  calculation, which is a validated novel method providing  better accuracy than the Friedewald equation in the  estimation of LDL-C.  Cresenciano Genre et al. Annamaria Helling. 8101;751(02): 2061-2068  (http://education.[facility].com/faq/FAQ164)   . Total CHOL/HDL Ratio 01/09/2018 4.1  <5.0 (calc) Final  . Non-HDL Cholesterol (Calc) 01/09/2018 111  <130 mg/dL (calc) Final   Comment: For patients with diabetes plus 1 major ASCVD risk  factor, treating to a non-HDL-C goal of <100 mg/dL  (LDL-C of <70 mg/dL) is considered a therapeutic  option.   . CBC MORPHOLOGY  01/09/2018   NORMAL Final   Comment: Elliptocytes 1 + Anisocytosis 1 + Polychromasia 1 +   . Iron 01/09/2018 22* 50 - 180 mcg/dL Final  . TIBC 01/09/2018 472* 250 - 425 mcg/dL (calc) Final  . %SAT 01/09/2018 5* 15 - 60 % (calc) Final  . Ferritin 01/09/2018 5* 20 - 380 ng/mL Final  . Vitamin B-12 01/09/2018 297  200 - 1,100 pg/mL Final   Comment: . Please Note: Although the reference range for vitamin B12 is [phone] pg/mL, it has been reported that between 5 and 10% of patients with values between 200 and 400 pg/mL may experience neuropsychiatric and hematologic abnormalities due to occult B12 deficiency; less than 1% of patients with values above 400 pg/mL will have symptoms. .   . TEST NAME: 01/09/2018 IRON, TIBC AND FERRITIN PANEL   Final  . TEST CODE: 01/09/2018 [id number]   Final  . CLIENT CONTACT:  01/09/2018 Learta Codding   Final  . REPORT ALWAYS MESSAGE SIGNATURE 01/09/2018    Final   Comment: . The laboratory testing on this patient was verbally requested or confirmed by the ordering physician or his or her authorized representative after contact with an employee of [employer]. Federal regulations require that we maintain on file written authorization for all laboratory testing.  Accordingly we are asking that the ordering physician or his or her authorized representative sign a copy of this report and promptly return it to the client service representative. . . Signature:____________________________________________________ . Please fax this signed page to [phone] or return it via your [employer] courier.    Echo revealed: Study Conclusions  - Left ventricle: The cavity size was normal. Wall thickness was   increased in a pattern of mild LVH. Systolic function was   vigorous. The estimated ejection fraction was in the range of 65%   to 70%. Wall motion was normal; there were no regional wall   motion abnormalities. Doppler parameters are  consistent with   abnormal left ventricular relaxation (grade 1 diastolic   dysfunction). Indeterminate filling pressures. - Aortic valve: Trileaflet; mildly thickened, mildly calcified   leaflets. There was mild stenosis. Peak velocity (S): 218 cm/s.   Mean gradient (S): 10 mm Hg.  02/06/18 2 out of the 3 stool samples returned positive for blood.  Patient is still taking aspirin although he is also on his iron tablet.  Patient's hemoglobin in June of last year was 12.4.  In the wintertime, the patient suffered a back injury and began taking Aleve.  This seems like it is likely the time when the patient's hemoglobin began to drop.  This raises suspicions about an ulcer versus gastritis.  He is not taking a proton pump inhibitor however he has stopped his Aleve.  He is hesitant to see a stomach specialist.  He also reports dysuria and frequency and urgency for 24 hours.  Urinalysis shows +2 leukocyte esterase.  At that time, my plan was: I believe his dyspnea on exertion is likely related to his COPD compounded by his sudden anemia.  Based on his history, I suspect his anemia is due to NSAID use and I suspect he has gastritis.  I have recommended discontinuation of his aspirin.  I recommended starting pantoprazole 40 mg a day.  Continue iron sulfate 325 mg daily.  After much discussion, I finally convinced the patient is see a gastroenterologist.  Treat suspected urinary tract infection with Cipro 500 mg p.o. twice daily.  I suspect prostatitis and therefore I will treat the patient for a total of 10 days  02/27/18 Patient saw no benefit on Cipro.  Urine culture eventually returned enterococcus infection which was sensitive to ampicillin, vancomycin, and nitrofurantoin.  Patient was switched to 7 days of nitrofurantoin 100 mg p.o. twice daily.  Patient states that after 5 days, the symptoms gradually subsided and had essentially gone away.  However within 2 days of stopping the antibiotics, the symptoms  have returned.  He reports lower abdominal discomfort, pelvic pressure, decreased urine output, frequency, burning.  Blood pressure is extremely low today at 90/58.  Patient is afebrile.  However he is short of breath.  He also reports feeling weak and tired.  I believe the patient is showing evidence of infection and possibly hypotension secondary to dehydration although I cannot exclude sepsis.  Patient refuses hospitalization.  Fingerstick hemoglobin obtained today in the office was 12.1 suggesting against an acute GI  bleed.  At that time, my plan was:  Patient is unable to provide a urine sample at the present time.  He reports decreased urine output over the last few days.  He has been off antibiotics now for almost a week.  Symptoms are worsening.  I believe he has an untreated Enterococcus faecalis infection/UTI.  I have recommended hospitalization and IV antibiotics given his presentation and relative hypotension.  Patient refuses hospitalization.  Fingerstick hemoglobin today rules out an acute GI bleed as a source of his hypotension.  I have recommended discontinuation of amlodipine, hydrochlorothiazide, and losartan.  Patient was given 1 L of normal saline today in the office due to dehydration and hypotension.  I will start the patient on Augmentin 875 mg p.o. twice daily and I will treat the patient for 2 weeks due to likely complicated urinary tract infection versus prostatitis.  Given uncertain augmentin sensitivity, I will also add nitrofurantoin 100 mg p.o. twice daily.  Although enterococcus shows sensitivity to this antibiotic, I am concerned that it cannot reach therapeutic levels to treat possible pyelonephritis.  Therefore, hopefully Augmentin, will provide adequate coverage given its sensitivity to ampicillin.  I have recommended hospitalization however the patient declined [place].  I want him to return tomorrow for recheck.  He is to go to hospital if  worsening.  03/01/18 Patient states that he had several episodes of vomiting and diarrhea after he left my office.  The vomiting has since subsided.  He continues to have some loose stools although better.  He feels better after receiving IV fluids.  Blood pressure is slightly improved today compared to his last visit. Wt Readings from Last 3 Encounters:  03/01/18 163 lb (73.9 kg)  02/27/18 161 lb (73 kg)  02/06/18 167 lb (75.8 kg)   Patient has gained 2 pounds since his last visit and I believe the majority of this is fluid.  He is still not eating well.  Most recent lab work revealed hypokalemia, prerenal azotemia with a creatinine of 1.66, hypercalcemia with a calcium of 11.8, mild leukocytosis with a white blood cell count of 13, and improvement in his hemoglobin now up to greater than 11.  He previously was at 8 when he was first diagnosed with iron deficiency anemia and a GI bleed.  Past Medical History:  Diagnosis Date  . Arthritis   . COPD (chronic obstructive pulmonary disease) ([place])   . Emphysema of lung ([place])   . Hyperparathyroidism, primary ([place])   . Hypertension   . Prediabetes    Past Surgical History:  Procedure Laterality Date  . APPENDECTOMY  1956  . [place]  . ROTATOR CUFF REPAIR  11/2011   Current Outpatient Medications on File Prior to Visit  Medication Sig Dispense Refill  . albuterol (PROVENTIL HFA;VENTOLIN HFA) 108 (90 Base) MCG/ACT inhaler Inhale 2 puffs into the lungs every 6 (six) hours as needed for wheezing or shortness of breath. 1 Inhaler 3  . amLODipine (NORVASC) 10 MG tablet TAKE 1 TABLET (10 MG TOTAL) BY MOUTH DAILY. 90 tablet 3  . amoxicillin-clavulanate (AUGMENTIN) 875-125 MG tablet Take 1 tablet by mouth 2 (two) times daily. 28 tablet 0  . EPINEPHrine (EPIPEN 2-PAK) 0.3 mg/0.3 mL DEVI Inject 0.3 mLs (0.3 mg total) into the muscle once. 1 Device 2  . hydrochlorothiazide (HYDRODIURIL) 25 MG tablet TAKE 1 TABLET (25 MG TOTAL) BY MOUTH DAILY.  90 tablet 3  . metoprolol tartrate (LOPRESSOR) 50 MG tablet Take 1  tablet (50 mg total) by mouth 2 (two) times daily. (Patient taking differently: Take 25 mg by mouth 2 (two) times daily. ) 60 tablet 11  . nitrofurantoin, macrocrystal-monohydrate, (MACROBID) 100 MG capsule Take 1 capsule (100 mg total) by mouth 2 (two) times daily. 14 capsule 0  . pantoprazole (PROTONIX) 40 MG tablet Take 1 tablet (40 mg total) by mouth daily. 30 tablet 3  . umeclidinium-vilanterol (ANORO ELLIPTA) 62.5-25 MCG/INH AEPB INHALE ONE PUFF DAILY 30 each 0  . losartan (COZAAR) 100 MG tablet Take 1 tablet (100 mg total) by mouth daily. (Patient not taking: Reported on 03/01/2018) 90 tablet 3  . potassium chloride SA (K-DUR,KLOR-CON) 20 MEQ tablet Take 1 tablet (20 mEq total) by mouth once for 1 dose. 30 tablet 0   No current facility-administered medications on file prior to visit.    No Known Allergies Social History   Socioeconomic History  . Marital status: Married    Spouse name: Not on file  . Number of children: Not on file  . Years of education: Not on file  . Highest education level: Not on file  Occupational History  . Not on file  Social Needs  . Financial resource strain: Not on file  . Food insecurity:    Worry: Not on file    Inability: Not on file  . Transportation needs:    Medical: Not on file    Non-medical: Not on file  Tobacco Use  . Smoking status: Former Smoker    Types: Cigarettes  . Smokeless tobacco: Former Systems developer    Quit date: 11/14/1993  . Tobacco comment: 3 packs a day  Substance and Sexual Activity  . Alcohol use: Yes    Comment: 3-4 beers a day prn  . Drug use: No  . Sexual activity: Not on file  Lifestyle  . Physical activity:    Days per week: Not on file    Minutes per session: Not on file  . Stress: Not on file  Relationships  . Social connections:    Talks on phone: Not on file    Gets together: Not on file    Attends religious service: Not on file    Active  member of club or organization: Not on file    Attends meetings of clubs or organizations: Not on file    Relationship status: Not on file  . Intimate partner violence:    Fear of current or ex partner: Not on file    Emotionally abused: Not on file    Physically abused: Not on file    Forced sexual activity: Not on file  Other Topics Concern  . Not on file  Social History Narrative  . Not on file      Review of Systems  All other systems reviewed and are negative.      Objective:   Physical Exam  Constitutional: He appears well-developed and well-nourished. He has a sickly appearance.  HENT:  Mouth/Throat: Mucous membranes are dry.  Neck: Neck supple. No JVD present.  Cardiovascular: Normal rate, regular rhythm and normal heart sounds.  Pulmonary/Chest: He has wheezes. He has rales.  Abdominal: Soft. Bowel sounds are normal. He exhibits no distension. There is no tenderness. There is no rebound.  Musculoskeletal: He exhibits no edema.  Lymphadenopathy:    He has no cervical adenopathy.  Vitals reviewed.         Assessment & Plan:  Hypercalcemia - Plan: Parathyroid hormone, intact (no Ca), PTH-Related Peptide, COMPLETE METABOLIC  PANEL WITH GFR, CBC with Differential/Platelet  Burning with urination  Iron deficiency anemia, unspecified iron deficiency anemia type  Enterococcus faecalis infection  I am concerned about hyperparathyroidism as a cause of his hypercalcemia versus cancer related hypercalcemia.  Therefore I will check a PTH.  I have discontinued his hydrochlorothiazide.  I have encouraged hydration, I will check a parathyroid related peptide.  If there is no evidence of hyperparathyroidism, evaluate for lung malignancy as a potential cause of cancer induced hypercalcemia.  Patient seems to be improving from his urinary tract infection.  His dysuria has improved and his urinalysis shows less contaminant than previous.  He continues to have symptoms and I have  encouraged him to continue antibiotics but he seems to be improving.  His anemia has improved however we need to arrange follow-up with GI once we have ensured resolution of his urinary tract infection, acute kidney injury, hypokalemia, and hypercalcemia.  Recheck the patient next week or sooner if worsening

## 2018-03-02 LAB — PARATHYROID HORMONE, INTACT (NO CA): PTH: 135 pg/mL — AB (ref 14–64)

## 2018-03-05 ENCOUNTER — Other Ambulatory Visit: Payer: Self-pay | Admitting: *Deleted

## 2018-03-05 MED ORDER — FERROUS SULFATE 325 (65 FE) MG PO TABS: 325.0000 mg | ORAL_TABLET | Freq: Two times a day (BID) | ORAL | 3 refills | Status: AC

## 2018-03-05 MED ORDER — PANTOPRAZOLE SODIUM 40 MG PO TBEC: 40.0000 mg | DELAYED_RELEASE_TABLET | Freq: Every day | ORAL | 3 refills | Status: AC

## 2018-03-06 DIAGNOSIS — D508 Other iron deficiency anemias: Secondary | ICD-10-CM | POA: Diagnosis not present

## 2018-03-06 DIAGNOSIS — R195 Other fecal abnormalities: Secondary | ICD-10-CM | POA: Diagnosis not present

## 2018-03-06 DIAGNOSIS — R748 Abnormal levels of other serum enzymes: Secondary | ICD-10-CM | POA: Diagnosis not present

## 2018-03-06 LAB — PTH-RELATED PEPTIDE: PTH-Related Protein (PTH-RP): 13 pg/mL — ABNORMAL LOW (ref 14–27)

## 2018-03-07 DIAGNOSIS — R195 Other fecal abnormalities: Secondary | ICD-10-CM | POA: Diagnosis not present

## 2018-03-07 DIAGNOSIS — Z1211 Encounter for screening for malignant neoplasm of colon: Secondary | ICD-10-CM | POA: Diagnosis not present

## 2018-03-07 DIAGNOSIS — D649 Anemia, unspecified: Secondary | ICD-10-CM | POA: Diagnosis not present

## 2018-03-09 DIAGNOSIS — K2950 Unspecified chronic gastritis without bleeding: Secondary | ICD-10-CM | POA: Diagnosis not present

## 2018-03-09 DIAGNOSIS — Z87891 Personal history of nicotine dependence: Secondary | ICD-10-CM | POA: Diagnosis not present

## 2018-03-09 DIAGNOSIS — C180 Malignant neoplasm of cecum: Secondary | ICD-10-CM | POA: Diagnosis not present

## 2018-03-09 DIAGNOSIS — R195 Other fecal abnormalities: Secondary | ICD-10-CM | POA: Diagnosis not present

## 2018-03-09 DIAGNOSIS — Z87440 Personal history of urinary (tract) infections: Secondary | ICD-10-CM | POA: Diagnosis not present

## 2018-03-09 DIAGNOSIS — D490 Neoplasm of unspecified behavior of digestive system: Secondary | ICD-10-CM | POA: Diagnosis not present

## 2018-03-09 DIAGNOSIS — R918 Other nonspecific abnormal finding of lung field: Secondary | ICD-10-CM | POA: Diagnosis not present

## 2018-03-09 DIAGNOSIS — Z79899 Other long term (current) drug therapy: Secondary | ICD-10-CM | POA: Diagnosis not present

## 2018-03-09 DIAGNOSIS — K635 Polyp of colon: Secondary | ICD-10-CM | POA: Diagnosis not present

## 2018-03-09 DIAGNOSIS — D127 Benign neoplasm of rectosigmoid junction: Secondary | ICD-10-CM | POA: Diagnosis not present

## 2018-03-09 DIAGNOSIS — K2970 Gastritis, unspecified, without bleeding: Secondary | ICD-10-CM | POA: Diagnosis not present

## 2018-03-09 DIAGNOSIS — K621 Rectal polyp: Secondary | ICD-10-CM | POA: Diagnosis not present

## 2018-03-09 DIAGNOSIS — C188 Malignant neoplasm of overlapping sites of colon: Secondary | ICD-10-CM | POA: Diagnosis not present

## 2018-03-09 DIAGNOSIS — K3189 Other diseases of stomach and duodenum: Secondary | ICD-10-CM | POA: Diagnosis not present

## 2018-03-09 DIAGNOSIS — D125 Benign neoplasm of sigmoid colon: Secondary | ICD-10-CM | POA: Diagnosis not present

## 2018-03-09 DIAGNOSIS — K6389 Other specified diseases of intestine: Secondary | ICD-10-CM | POA: Diagnosis not present

## 2018-03-09 DIAGNOSIS — R0602 Shortness of breath: Secondary | ICD-10-CM | POA: Diagnosis not present

## 2018-03-09 DIAGNOSIS — K210 Gastro-esophageal reflux disease with esophagitis: Secondary | ICD-10-CM | POA: Diagnosis not present

## 2018-03-09 DIAGNOSIS — K623 Rectal prolapse: Secondary | ICD-10-CM | POA: Diagnosis not present

## 2018-03-09 DIAGNOSIS — D649 Anemia, unspecified: Secondary | ICD-10-CM | POA: Diagnosis not present

## 2018-03-09 DIAGNOSIS — J449 Chronic obstructive pulmonary disease, unspecified: Secondary | ICD-10-CM | POA: Diagnosis not present

## 2018-03-09 DIAGNOSIS — K921 Melena: Secondary | ICD-10-CM | POA: Diagnosis not present

## 2018-03-09 DIAGNOSIS — K209 Esophagitis, unspecified: Secondary | ICD-10-CM | POA: Diagnosis not present

## 2018-03-22 ENCOUNTER — Other Ambulatory Visit: Payer: Self-pay | Admitting: Family Medicine

## 2018-03-26 ENCOUNTER — Encounter: Payer: Self-pay | Admitting: Family Medicine

## 2018-03-26 DIAGNOSIS — R918 Other nonspecific abnormal finding of lung field: Secondary | ICD-10-CM | POA: Insufficient documentation

## 2018-03-26 DIAGNOSIS — C180 Malignant neoplasm of cecum: Secondary | ICD-10-CM | POA: Insufficient documentation

## 2018-03-27 DIAGNOSIS — C189 Malignant neoplasm of colon, unspecified: Secondary | ICD-10-CM | POA: Diagnosis not present

## 2018-03-27 DIAGNOSIS — C180 Malignant neoplasm of cecum: Secondary | ICD-10-CM | POA: Diagnosis not present

## 2018-03-29 DIAGNOSIS — G9340 Encephalopathy, unspecified: Secondary | ICD-10-CM | POA: Diagnosis not present

## 2018-03-29 DIAGNOSIS — I493 Ventricular premature depolarization: Secondary | ICD-10-CM | POA: Diagnosis not present

## 2018-03-29 DIAGNOSIS — R414 Neurologic neglect syndrome: Secondary | ICD-10-CM | POA: Diagnosis not present

## 2018-03-29 DIAGNOSIS — R05 Cough: Secondary | ICD-10-CM | POA: Diagnosis not present

## 2018-03-29 DIAGNOSIS — I6523 Occlusion and stenosis of bilateral carotid arteries: Secondary | ICD-10-CM | POA: Diagnosis not present

## 2018-03-29 DIAGNOSIS — G936 Cerebral edema: Secondary | ICD-10-CM | POA: Diagnosis not present

## 2018-03-29 DIAGNOSIS — I6381 Other cerebral infarction due to occlusion or stenosis of small artery: Secondary | ICD-10-CM | POA: Diagnosis not present

## 2018-03-29 DIAGNOSIS — C189 Malignant neoplasm of colon, unspecified: Secondary | ICD-10-CM | POA: Diagnosis not present

## 2018-03-29 DIAGNOSIS — R402142 Coma scale, eyes open, spontaneous, at arrival to emergency department: Secondary | ICD-10-CM | POA: Diagnosis present

## 2018-03-29 DIAGNOSIS — I083 Combined rheumatic disorders of mitral, aortic and tricuspid valves: Secondary | ICD-10-CM | POA: Diagnosis not present

## 2018-03-29 DIAGNOSIS — G939 Disorder of brain, unspecified: Secondary | ICD-10-CM | POA: Diagnosis not present

## 2018-03-29 DIAGNOSIS — I63412 Cerebral infarction due to embolism of left middle cerebral artery: Secondary | ICD-10-CM | POA: Diagnosis not present

## 2018-03-29 DIAGNOSIS — I471 Supraventricular tachycardia: Secondary | ICD-10-CM | POA: Diagnosis not present

## 2018-03-29 DIAGNOSIS — Z85118 Personal history of other malignant neoplasm of bronchus and lung: Secondary | ICD-10-CM | POA: Diagnosis not present

## 2018-03-29 DIAGNOSIS — R930 Abnormal findings on diagnostic imaging of skull and head, not elsewhere classified: Secondary | ICD-10-CM | POA: Diagnosis not present

## 2018-03-29 DIAGNOSIS — I4891 Unspecified atrial fibrillation: Secondary | ICD-10-CM | POA: Diagnosis not present

## 2018-03-29 DIAGNOSIS — I619 Nontraumatic intracerebral hemorrhage, unspecified: Secondary | ICD-10-CM | POA: Diagnosis not present

## 2018-03-29 DIAGNOSIS — C801 Malignant (primary) neoplasm, unspecified: Secondary | ICD-10-CM | POA: Diagnosis not present

## 2018-03-29 DIAGNOSIS — Z79899 Other long term (current) drug therapy: Secondary | ICD-10-CM | POA: Diagnosis not present

## 2018-03-29 DIAGNOSIS — R9089 Other abnormal findings on diagnostic imaging of central nervous system: Secondary | ICD-10-CM | POA: Diagnosis not present

## 2018-03-29 DIAGNOSIS — R911 Solitary pulmonary nodule: Secondary | ICD-10-CM | POA: Diagnosis not present

## 2018-03-29 DIAGNOSIS — I611 Nontraumatic intracerebral hemorrhage in hemisphere, cortical: Secondary | ICD-10-CM | POA: Diagnosis not present

## 2018-03-29 DIAGNOSIS — I69320 Aphasia following cerebral infarction: Secondary | ICD-10-CM | POA: Diagnosis not present

## 2018-03-29 DIAGNOSIS — I69351 Hemiplegia and hemiparesis following cerebral infarction affecting right dominant side: Secondary | ICD-10-CM | POA: Diagnosis not present

## 2018-03-29 DIAGNOSIS — J449 Chronic obstructive pulmonary disease, unspecified: Secondary | ICD-10-CM | POA: Diagnosis not present

## 2018-03-29 DIAGNOSIS — G459 Transient cerebral ischemic attack, unspecified: Secondary | ICD-10-CM | POA: Diagnosis not present

## 2018-03-29 DIAGNOSIS — C180 Malignant neoplasm of cecum: Secondary | ICD-10-CM | POA: Diagnosis present

## 2018-03-29 DIAGNOSIS — R4182 Altered mental status, unspecified: Secondary | ICD-10-CM | POA: Diagnosis not present

## 2018-03-29 DIAGNOSIS — Z87891 Personal history of nicotine dependence: Secondary | ICD-10-CM | POA: Diagnosis not present

## 2018-03-29 DIAGNOSIS — R402242 Coma scale, best verbal response, confused conversation, at arrival to emergency department: Secondary | ICD-10-CM | POA: Diagnosis present

## 2018-03-29 DIAGNOSIS — I248 Other forms of acute ischemic heart disease: Secondary | ICD-10-CM | POA: Diagnosis present

## 2018-03-29 DIAGNOSIS — G9349 Other encephalopathy: Secondary | ICD-10-CM | POA: Diagnosis not present

## 2018-03-29 DIAGNOSIS — Z0389 Encounter for observation for other suspected diseases and conditions ruled out: Secondary | ICD-10-CM | POA: Diagnosis not present

## 2018-03-29 DIAGNOSIS — R402362 Coma scale, best motor response, obeys commands, at arrival to emergency department: Secondary | ICD-10-CM | POA: Diagnosis present

## 2018-03-29 DIAGNOSIS — R410 Disorientation, unspecified: Secondary | ICD-10-CM | POA: Diagnosis not present

## 2018-03-29 DIAGNOSIS — R9431 Abnormal electrocardiogram [ECG] [EKG]: Secondary | ICD-10-CM | POA: Diagnosis not present

## 2018-03-29 DIAGNOSIS — R590 Localized enlarged lymph nodes: Secondary | ICD-10-CM | POA: Diagnosis not present

## 2018-03-30 DIAGNOSIS — C180 Malignant neoplasm of cecum: Secondary | ICD-10-CM | POA: Diagnosis not present

## 2018-04-06 DIAGNOSIS — I69820 Aphasia following other cerebrovascular disease: Secondary | ICD-10-CM | POA: Diagnosis not present

## 2018-04-06 DIAGNOSIS — I69351 Hemiplegia and hemiparesis following cerebral infarction affecting right dominant side: Secondary | ICD-10-CM | POA: Diagnosis not present

## 2018-04-06 DIAGNOSIS — J449 Chronic obstructive pulmonary disease, unspecified: Secondary | ICD-10-CM | POA: Diagnosis not present

## 2018-04-06 DIAGNOSIS — C180 Malignant neoplasm of cecum: Secondary | ICD-10-CM | POA: Diagnosis not present

## 2018-04-09 DIAGNOSIS — J449 Chronic obstructive pulmonary disease, unspecified: Secondary | ICD-10-CM | POA: Diagnosis not present

## 2018-04-09 DIAGNOSIS — I69351 Hemiplegia and hemiparesis following cerebral infarction affecting right dominant side: Secondary | ICD-10-CM | POA: Diagnosis not present

## 2018-04-09 DIAGNOSIS — I69820 Aphasia following other cerebrovascular disease: Secondary | ICD-10-CM | POA: Diagnosis not present

## 2018-04-09 DIAGNOSIS — C180 Malignant neoplasm of cecum: Secondary | ICD-10-CM | POA: Diagnosis not present

## 2018-04-10 DIAGNOSIS — I69351 Hemiplegia and hemiparesis following cerebral infarction affecting right dominant side: Secondary | ICD-10-CM | POA: Diagnosis not present

## 2018-04-10 DIAGNOSIS — C180 Malignant neoplasm of cecum: Secondary | ICD-10-CM | POA: Diagnosis not present

## 2018-04-10 DIAGNOSIS — J449 Chronic obstructive pulmonary disease, unspecified: Secondary | ICD-10-CM | POA: Diagnosis not present

## 2018-04-10 DIAGNOSIS — I69820 Aphasia following other cerebrovascular disease: Secondary | ICD-10-CM | POA: Diagnosis not present

## 2018-04-12 ENCOUNTER — Ambulatory Visit: Payer: Medicare Other | Admitting: Gastroenterology

## 2018-04-12 ENCOUNTER — Encounter: Payer: Self-pay | Admitting: Gastroenterology

## 2018-04-12 ENCOUNTER — Telehealth: Payer: Self-pay | Admitting: Gastroenterology

## 2018-04-12 DIAGNOSIS — I69820 Aphasia following other cerebrovascular disease: Secondary | ICD-10-CM | POA: Diagnosis not present

## 2018-04-12 DIAGNOSIS — C180 Malignant neoplasm of cecum: Secondary | ICD-10-CM | POA: Diagnosis not present

## 2018-04-12 DIAGNOSIS — J449 Chronic obstructive pulmonary disease, unspecified: Secondary | ICD-10-CM | POA: Diagnosis not present

## 2018-04-12 DIAGNOSIS — I69351 Hemiplegia and hemiparesis following cerebral infarction affecting right dominant side: Secondary | ICD-10-CM | POA: Diagnosis not present

## 2018-04-12 NOTE — Telephone Encounter (Signed)
Patient was a no show and letter sent  °

## 2018-04-12 NOTE — Telephone Encounter (Signed)
Looks like patient went to [facility] by choice, see referral notes.

## 2018-04-13 DIAGNOSIS — I69351 Hemiplegia and hemiparesis following cerebral infarction affecting right dominant side: Secondary | ICD-10-CM | POA: Diagnosis not present

## 2018-04-13 DIAGNOSIS — J449 Chronic obstructive pulmonary disease, unspecified: Secondary | ICD-10-CM | POA: Diagnosis not present

## 2018-04-13 DIAGNOSIS — I69820 Aphasia following other cerebrovascular disease: Secondary | ICD-10-CM | POA: Diagnosis not present

## 2018-04-13 DIAGNOSIS — C180 Malignant neoplasm of cecum: Secondary | ICD-10-CM | POA: Diagnosis not present

## 2018-04-16 DIAGNOSIS — I69820 Aphasia following other cerebrovascular disease: Secondary | ICD-10-CM | POA: Diagnosis not present

## 2018-04-16 DIAGNOSIS — C180 Malignant neoplasm of cecum: Secondary | ICD-10-CM | POA: Diagnosis not present

## 2018-04-16 DIAGNOSIS — J449 Chronic obstructive pulmonary disease, unspecified: Secondary | ICD-10-CM | POA: Diagnosis not present

## 2018-04-16 DIAGNOSIS — I69351 Hemiplegia and hemiparesis following cerebral infarction affecting right dominant side: Secondary | ICD-10-CM | POA: Diagnosis not present

## 2018-04-17 DIAGNOSIS — R911 Solitary pulmonary nodule: Secondary | ICD-10-CM | POA: Diagnosis not present

## 2018-04-17 DIAGNOSIS — E041 Nontoxic single thyroid nodule: Secondary | ICD-10-CM | POA: Diagnosis not present

## 2018-04-17 DIAGNOSIS — C189 Malignant neoplasm of colon, unspecified: Secondary | ICD-10-CM | POA: Diagnosis not present

## 2018-04-19 DIAGNOSIS — C180 Malignant neoplasm of cecum: Secondary | ICD-10-CM | POA: Diagnosis not present

## 2018-04-19 DIAGNOSIS — I69351 Hemiplegia and hemiparesis following cerebral infarction affecting right dominant side: Secondary | ICD-10-CM | POA: Diagnosis not present

## 2018-04-19 DIAGNOSIS — J449 Chronic obstructive pulmonary disease, unspecified: Secondary | ICD-10-CM | POA: Diagnosis not present

## 2018-04-19 DIAGNOSIS — I69820 Aphasia following other cerebrovascular disease: Secondary | ICD-10-CM | POA: Diagnosis not present

## 2018-04-25 DIAGNOSIS — J449 Chronic obstructive pulmonary disease, unspecified: Secondary | ICD-10-CM | POA: Diagnosis not present

## 2018-04-25 DIAGNOSIS — I69351 Hemiplegia and hemiparesis following cerebral infarction affecting right dominant side: Secondary | ICD-10-CM | POA: Diagnosis not present

## 2018-04-25 DIAGNOSIS — C180 Malignant neoplasm of cecum: Secondary | ICD-10-CM | POA: Diagnosis not present

## 2018-04-25 DIAGNOSIS — I69820 Aphasia following other cerebrovascular disease: Secondary | ICD-10-CM | POA: Diagnosis not present

## 2018-05-01 DIAGNOSIS — C180 Malignant neoplasm of cecum: Secondary | ICD-10-CM | POA: Diagnosis not present

## 2018-05-01 DIAGNOSIS — J449 Chronic obstructive pulmonary disease, unspecified: Secondary | ICD-10-CM | POA: Diagnosis not present

## 2018-05-01 DIAGNOSIS — I69820 Aphasia following other cerebrovascular disease: Secondary | ICD-10-CM | POA: Diagnosis not present

## 2018-05-01 DIAGNOSIS — I69351 Hemiplegia and hemiparesis following cerebral infarction affecting right dominant side: Secondary | ICD-10-CM | POA: Diagnosis not present

## 2018-05-16 DIAGNOSIS — C180 Malignant neoplasm of cecum: Secondary | ICD-10-CM | POA: Diagnosis not present

## 2018-05-16 DIAGNOSIS — I69820 Aphasia following other cerebrovascular disease: Secondary | ICD-10-CM | POA: Diagnosis not present

## 2018-05-16 DIAGNOSIS — J449 Chronic obstructive pulmonary disease, unspecified: Secondary | ICD-10-CM | POA: Diagnosis not present

## 2018-05-16 DIAGNOSIS — I69351 Hemiplegia and hemiparesis following cerebral infarction affecting right dominant side: Secondary | ICD-10-CM | POA: Diagnosis not present

## 2018-05-22 DIAGNOSIS — J449 Chronic obstructive pulmonary disease, unspecified: Secondary | ICD-10-CM | POA: Diagnosis not present

## 2018-05-22 DIAGNOSIS — I69351 Hemiplegia and hemiparesis following cerebral infarction affecting right dominant side: Secondary | ICD-10-CM | POA: Diagnosis not present

## 2018-05-22 DIAGNOSIS — C180 Malignant neoplasm of cecum: Secondary | ICD-10-CM | POA: Diagnosis not present

## 2018-05-22 DIAGNOSIS — I69820 Aphasia following other cerebrovascular disease: Secondary | ICD-10-CM | POA: Diagnosis not present

## 2018-05-23 DIAGNOSIS — Z8673 Personal history of transient ischemic attack (TIA), and cerebral infarction without residual deficits: Secondary | ICD-10-CM | POA: Diagnosis not present

## 2018-05-23 DIAGNOSIS — R911 Solitary pulmonary nodule: Secondary | ICD-10-CM | POA: Diagnosis not present

## 2018-05-23 DIAGNOSIS — J449 Chronic obstructive pulmonary disease, unspecified: Secondary | ICD-10-CM | POA: Diagnosis not present

## 2018-05-23 DIAGNOSIS — C189 Malignant neoplasm of colon, unspecified: Secondary | ICD-10-CM | POA: Diagnosis not present

## 2018-05-24 DIAGNOSIS — J449 Chronic obstructive pulmonary disease, unspecified: Secondary | ICD-10-CM | POA: Diagnosis not present

## 2018-05-24 DIAGNOSIS — C180 Malignant neoplasm of cecum: Secondary | ICD-10-CM | POA: Diagnosis not present

## 2018-05-24 DIAGNOSIS — I69820 Aphasia following other cerebrovascular disease: Secondary | ICD-10-CM | POA: Diagnosis not present

## 2018-05-24 DIAGNOSIS — I69351 Hemiplegia and hemiparesis following cerebral infarction affecting right dominant side: Secondary | ICD-10-CM | POA: Diagnosis not present

## 2018-05-29 DIAGNOSIS — J449 Chronic obstructive pulmonary disease, unspecified: Secondary | ICD-10-CM | POA: Diagnosis not present

## 2018-05-29 DIAGNOSIS — C180 Malignant neoplasm of cecum: Secondary | ICD-10-CM | POA: Diagnosis not present

## 2018-05-29 DIAGNOSIS — I69351 Hemiplegia and hemiparesis following cerebral infarction affecting right dominant side: Secondary | ICD-10-CM | POA: Diagnosis not present

## 2018-05-29 DIAGNOSIS — I69820 Aphasia following other cerebrovascular disease: Secondary | ICD-10-CM | POA: Diagnosis not present

## 2018-05-30 DIAGNOSIS — J449 Chronic obstructive pulmonary disease, unspecified: Secondary | ICD-10-CM | POA: Diagnosis not present

## 2018-05-30 DIAGNOSIS — C180 Malignant neoplasm of cecum: Secondary | ICD-10-CM | POA: Diagnosis not present

## 2018-05-30 DIAGNOSIS — I69820 Aphasia following other cerebrovascular disease: Secondary | ICD-10-CM | POA: Diagnosis not present

## 2018-05-30 DIAGNOSIS — I69351 Hemiplegia and hemiparesis following cerebral infarction affecting right dominant side: Secondary | ICD-10-CM | POA: Diagnosis not present

## 2018-06-01 DIAGNOSIS — J449 Chronic obstructive pulmonary disease, unspecified: Secondary | ICD-10-CM | POA: Diagnosis not present

## 2018-06-01 DIAGNOSIS — I69820 Aphasia following other cerebrovascular disease: Secondary | ICD-10-CM | POA: Diagnosis not present

## 2018-06-01 DIAGNOSIS — C180 Malignant neoplasm of cecum: Secondary | ICD-10-CM | POA: Diagnosis not present

## 2018-06-01 DIAGNOSIS — I69351 Hemiplegia and hemiparesis following cerebral infarction affecting right dominant side: Secondary | ICD-10-CM | POA: Diagnosis not present

## 2018-06-05 DIAGNOSIS — I69820 Aphasia following other cerebrovascular disease: Secondary | ICD-10-CM | POA: Diagnosis not present

## 2018-06-05 DIAGNOSIS — I69351 Hemiplegia and hemiparesis following cerebral infarction affecting right dominant side: Secondary | ICD-10-CM | POA: Diagnosis not present

## 2018-06-05 DIAGNOSIS — C180 Malignant neoplasm of cecum: Secondary | ICD-10-CM | POA: Diagnosis not present

## 2018-06-05 DIAGNOSIS — J449 Chronic obstructive pulmonary disease, unspecified: Secondary | ICD-10-CM | POA: Diagnosis not present

## 2018-06-07 DIAGNOSIS — J449 Chronic obstructive pulmonary disease, unspecified: Secondary | ICD-10-CM | POA: Diagnosis not present

## 2018-06-07 DIAGNOSIS — I69351 Hemiplegia and hemiparesis following cerebral infarction affecting right dominant side: Secondary | ICD-10-CM | POA: Diagnosis not present

## 2018-06-07 DIAGNOSIS — C180 Malignant neoplasm of cecum: Secondary | ICD-10-CM | POA: Diagnosis not present

## 2018-06-07 DIAGNOSIS — I69820 Aphasia following other cerebrovascular disease: Secondary | ICD-10-CM | POA: Diagnosis not present

## 2018-06-11 ENCOUNTER — Other Ambulatory Visit: Payer: Self-pay | Admitting: Family Medicine

## 2018-06-12 ENCOUNTER — Other Ambulatory Visit: Payer: Self-pay | Admitting: Family Medicine

## 2018-06-26 ENCOUNTER — Telehealth: Payer: Self-pay | Admitting: Gastroenterology

## 2018-06-26 ENCOUNTER — Ambulatory Visit: Payer: Medicare Other | Admitting: Gastroenterology

## 2018-06-26 ENCOUNTER — Encounter: Payer: Self-pay | Admitting: Gastroenterology

## 2018-06-26 NOTE — Telephone Encounter (Signed)
PATIENT WAS A NO SHOW AND LETTER SENT  °

## 2018-07-15 DEATH — deceased

## 2019-04-19 IMAGING — DX DG LUMBAR SPINE COMPLETE 4+V
5 series · 5 of 5 positions shown · non-contrast
Comparison: None.

CLINICAL DATA: Rt side sciatica x 6 weeks. No known injury. Hx
arthritis, htn

EXAM:
LUMBAR SPINE - COMPLETE 4+ VIEW

[l-spine ap]
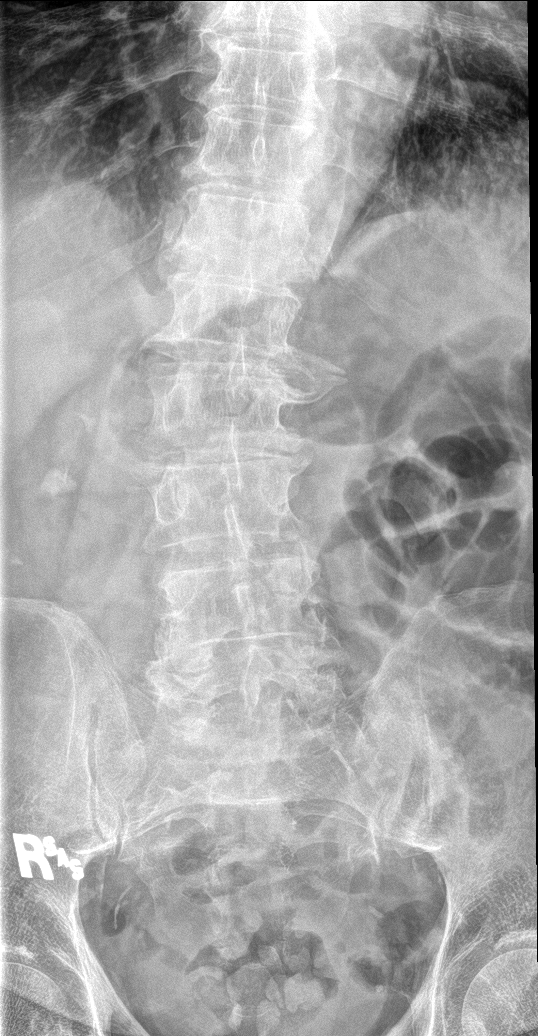

[l-spine obl (1 of 2)]
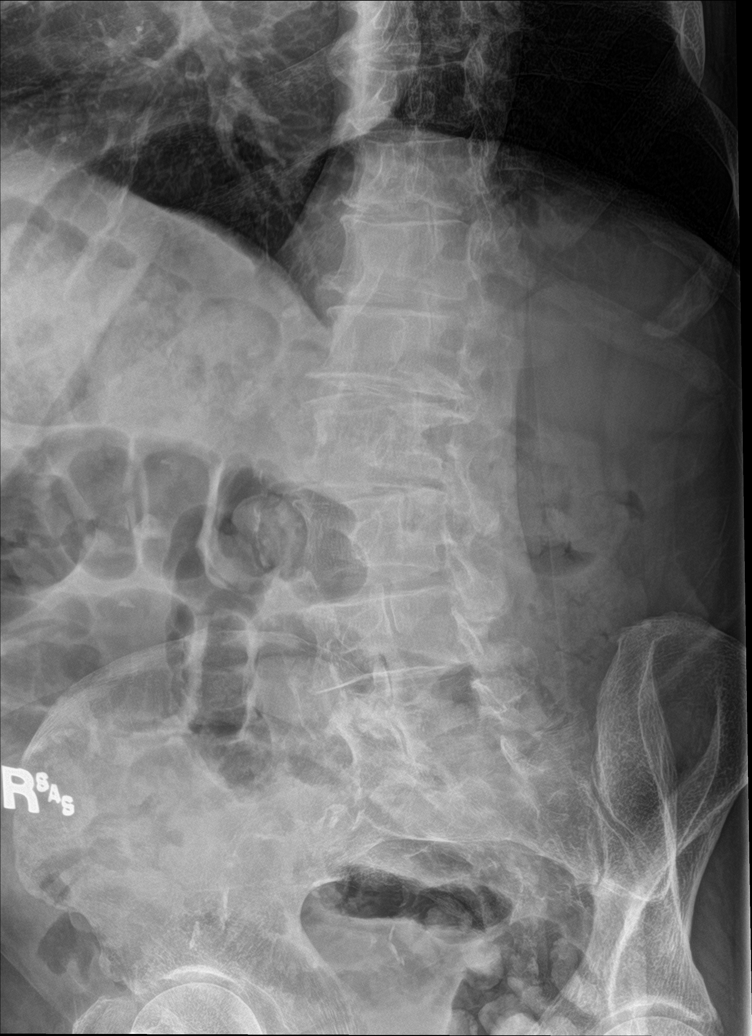

[l-spine obl (2 of 2)]
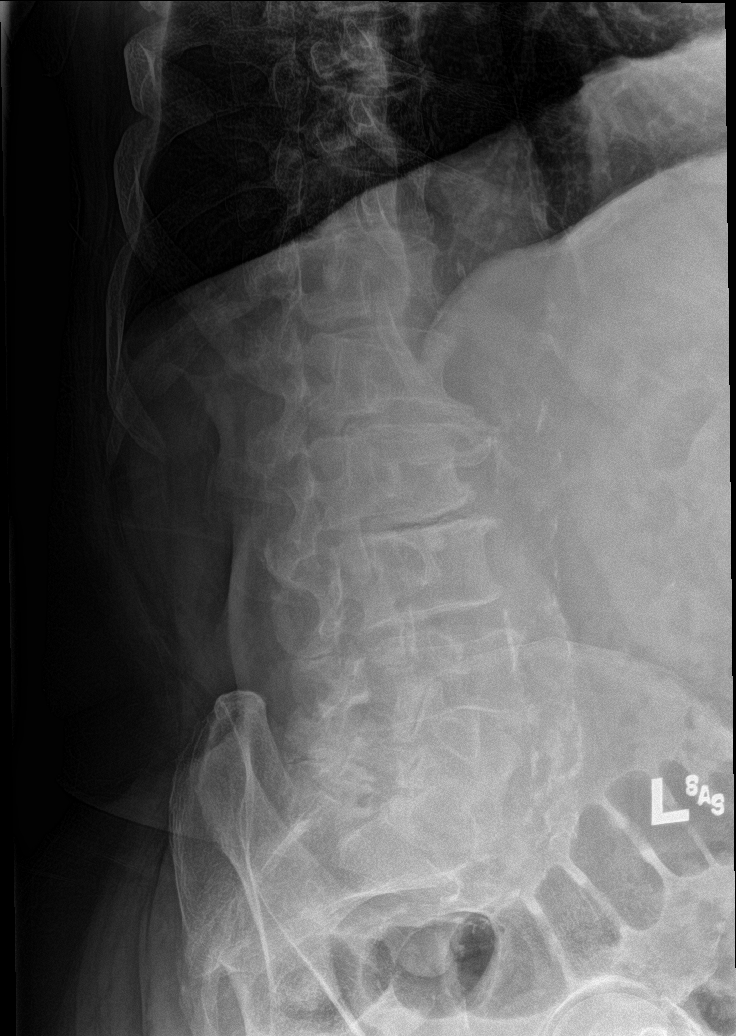

[l-spine lat]
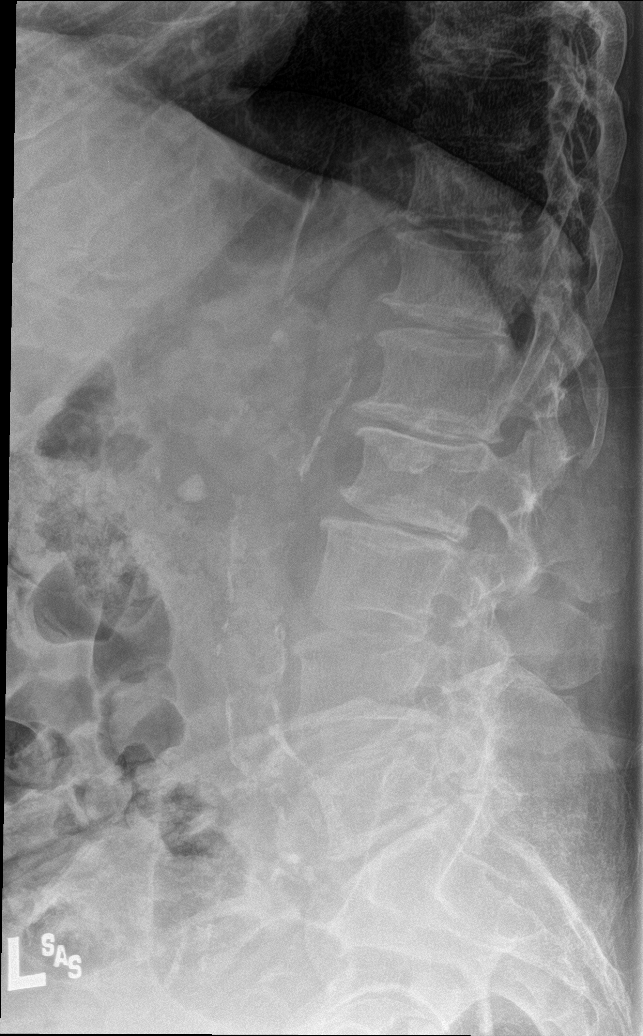

[l-spine spot]
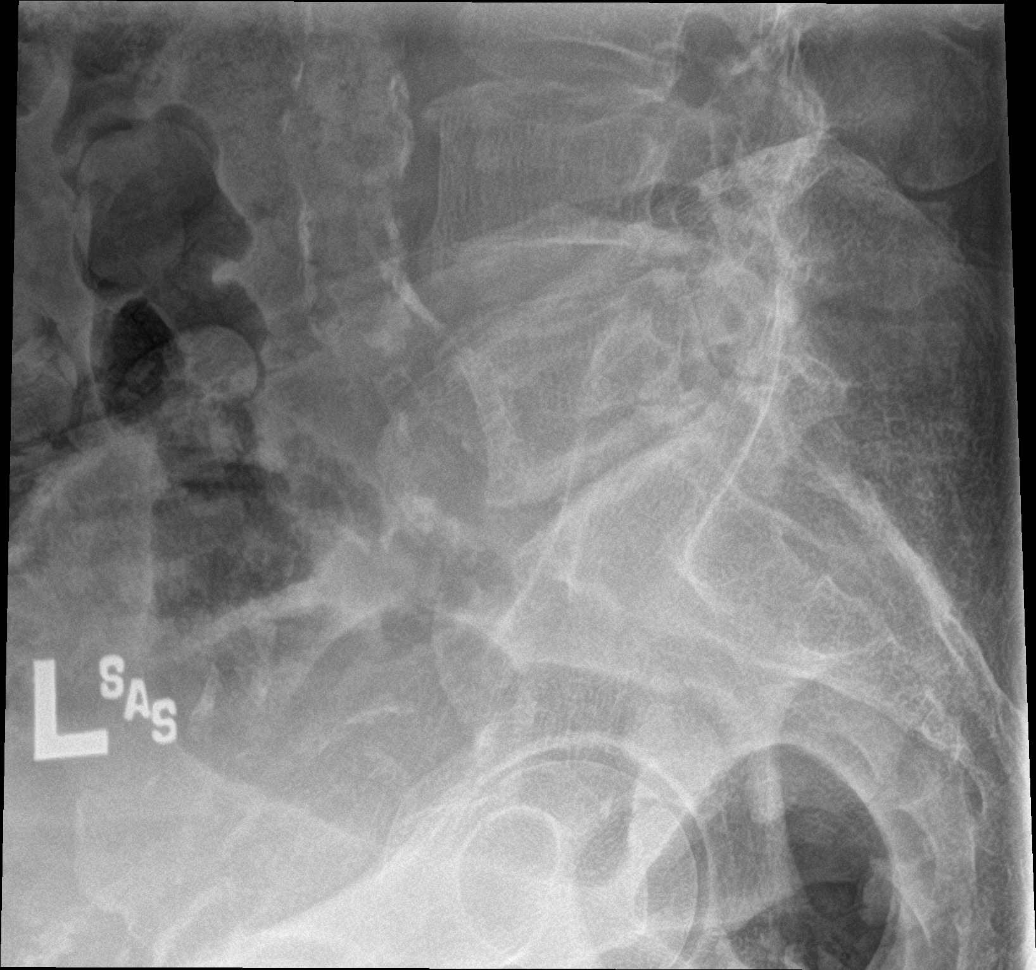

[5 of 5 positions shown; findings below may reference images not displayed]

FINDINGS: Moderate dextroscoliosis, measuring approximately 20 degrees,
centered at the L2 vertebral body level.

Degenerative changes throughout the lumbar spine, mild to moderate
in degree, with associated disc space narrowings and osseous
spurring. Slight anterolisthesis of L4 is likely related to the
underlying degenerative change.

No fracture line or displaced fracture fragment seen. No acute
appearing osseous abnormality. Upper sacrum appears intact and
normally aligned.

Atherosclerotic changes noted along the walls of the infrarenal
abdominal aorta. Calcifications noted in the right abdomen, most
likely renal stones or gallstones. Visualized paravertebral soft
tissues are otherwise unremarkable.
IMPRESSION: 1. Degenerative changes of the lumbar spine, mild to moderate in
degree. Suspect associated osseous neural foramen narrowings at
multiple levels with possible associated nerve root impingement, a
likely source for right-sided sciatica. Would consider nonemergent
lumbar spine MRI for further characterization.
2. Dextroscoliosis, approximately 20 degrees, centered at the L2
vertebral body level.
3. No acute findings.
4. Aortic atherosclerosis.
